# Patient Record
Sex: Male | Born: 1975 | Race: Black or African American | Hispanic: No | Marital: Single
Health system: Southern US, Community
[De-identification: ages and names within clinical notes are randomized; demographics above are authoritative.]

---

## 2005-04-11 ENCOUNTER — Emergency Department (HOSPITAL_COMMUNITY): Admission: EM | Admit: 2005-04-11 | Discharge: 2005-04-11 | Payer: Self-pay | Admitting: Emergency Medicine

## 2015-10-08 ENCOUNTER — Other Ambulatory Visit (HOSPITAL_COMMUNITY): Payer: Self-pay

## 2015-10-08 ENCOUNTER — Inpatient Hospital Stay (HOSPITAL_COMMUNITY): Payer: Medicaid Other

## 2015-10-08 ENCOUNTER — Emergency Department (HOSPITAL_COMMUNITY): Payer: Medicaid Other

## 2015-10-08 ENCOUNTER — Encounter (HOSPITAL_COMMUNITY): Payer: Self-pay | Admitting: *Deleted

## 2015-10-08 ENCOUNTER — Inpatient Hospital Stay (HOSPITAL_COMMUNITY)
Admission: EM | Admit: 2015-10-08 | Discharge: 2015-10-29 | DRG: 637 | Disposition: E | Payer: Medicaid Other | Attending: Pulmonary Disease | Admitting: Pulmonary Disease

## 2015-10-08 DIAGNOSIS — N179 Acute kidney failure, unspecified: Secondary | ICD-10-CM

## 2015-10-08 DIAGNOSIS — I10 Essential (primary) hypertension: Secondary | ICD-10-CM | POA: Diagnosis present

## 2015-10-08 DIAGNOSIS — R6510 Systemic inflammatory response syndrome (SIRS) of non-infectious origin without acute organ dysfunction: Secondary | ICD-10-CM

## 2015-10-08 DIAGNOSIS — R579 Shock, unspecified: Secondary | ICD-10-CM | POA: Diagnosis not present

## 2015-10-08 DIAGNOSIS — R402433 Glasgow coma scale score 3-8, at hospital admission: Secondary | ICD-10-CM | POA: Diagnosis present

## 2015-10-08 DIAGNOSIS — G9341 Metabolic encephalopathy: Secondary | ICD-10-CM | POA: Diagnosis present

## 2015-10-08 DIAGNOSIS — E875 Hyperkalemia: Secondary | ICD-10-CM | POA: Diagnosis not present

## 2015-10-08 DIAGNOSIS — Z833 Family history of diabetes mellitus: Secondary | ICD-10-CM

## 2015-10-08 DIAGNOSIS — R569 Unspecified convulsions: Secondary | ICD-10-CM | POA: Diagnosis present

## 2015-10-08 DIAGNOSIS — I4901 Ventricular fibrillation: Secondary | ICD-10-CM

## 2015-10-08 DIAGNOSIS — E1311 Other specified diabetes mellitus with ketoacidosis with coma: Secondary | ICD-10-CM | POA: Diagnosis present

## 2015-10-08 DIAGNOSIS — D689 Coagulation defect, unspecified: Secondary | ICD-10-CM

## 2015-10-08 DIAGNOSIS — D65 Disseminated intravascular coagulation [defibrination syndrome]: Secondary | ICD-10-CM | POA: Diagnosis present

## 2015-10-08 DIAGNOSIS — G40309 Generalized idiopathic epilepsy and epileptic syndromes, not intractable, without status epilepticus: Secondary | ICD-10-CM | POA: Diagnosis present

## 2015-10-08 DIAGNOSIS — J9690 Respiratory failure, unspecified, unspecified whether with hypoxia or hypercapnia: Secondary | ICD-10-CM

## 2015-10-08 DIAGNOSIS — R001 Bradycardia, unspecified: Secondary | ICD-10-CM | POA: Diagnosis not present

## 2015-10-08 DIAGNOSIS — I248 Other forms of acute ischemic heart disease: Secondary | ICD-10-CM | POA: Diagnosis present

## 2015-10-08 DIAGNOSIS — E860 Dehydration: Secondary | ICD-10-CM | POA: Diagnosis present

## 2015-10-08 DIAGNOSIS — T68XXXA Hypothermia, initial encounter: Secondary | ICD-10-CM

## 2015-10-08 DIAGNOSIS — R011 Cardiac murmur, unspecified: Secondary | ICD-10-CM | POA: Diagnosis present

## 2015-10-08 DIAGNOSIS — I259 Chronic ischemic heart disease, unspecified: Secondary | ICD-10-CM

## 2015-10-08 DIAGNOSIS — D649 Anemia, unspecified: Secondary | ICD-10-CM | POA: Diagnosis present

## 2015-10-08 DIAGNOSIS — E1110 Type 2 diabetes mellitus with ketoacidosis without coma: Secondary | ICD-10-CM | POA: Diagnosis present

## 2015-10-08 DIAGNOSIS — I462 Cardiac arrest due to underlying cardiac condition: Secondary | ICD-10-CM | POA: Diagnosis present

## 2015-10-08 DIAGNOSIS — E872 Acidosis: Secondary | ICD-10-CM

## 2015-10-08 LAB — URINALYSIS, ROUTINE W REFLEX MICROSCOPIC
BILIRUBIN URINE: NEGATIVE
Glucose, UA: >1000 mg/dL — AB
Ketones, ur: 40 mg/dL — AB
Leukocytes, UA: NEGATIVE
Nitrite: NEGATIVE
PROTEIN: NEGATIVE mg/dL
Specific Gravity, Urine: 1.01 (ref 1.005–1.030)
UROBILINOGEN UA: 0.2 mg/dL (ref 0.0–1.0)
pH: 5.5 (ref 5.0–8.0)

## 2015-10-08 LAB — CBG MONITORING, ED
GLUCOSE-CAPILLARY: 593 mg/dL — AB (ref 65–99)
Glucose-Capillary: >600 mg/dL (ref 65–99)

## 2015-10-08 LAB — CBC WITH DIFFERENTIAL/PLATELET
BASOS ABS: 0.1 10*3/uL (ref 0.0–0.1)
BASOS PCT: 1 %
EOS ABS: 0.1 10*3/uL (ref 0.0–0.7)
Eosinophils Relative: 1 %
HCT: 31 % — ABNORMAL LOW (ref 39.0–52.0)
Hemoglobin: 9.9 g/dL — ABNORMAL LOW (ref 13.0–17.0)
Lymphocytes Relative: 57 %
Lymphs Abs: 6.7 10*3/uL — ABNORMAL HIGH (ref 0.7–4.0)
MCH: 32.2 pg (ref 26.0–34.0)
MCHC: 31.9 g/dL (ref 30.0–36.0)
MCV: 101 fL — ABNORMAL HIGH (ref 78.0–100.0)
MONO ABS: 0.7 10*3/uL (ref 0.1–1.0)
Monocytes Relative: 6 %
NEUTROS ABS: 4.3 10*3/uL (ref 1.7–7.7)
Neutrophils Relative %: 35 %
PLATELETS: ADEQUATE 10*3/uL (ref 150–400)
RBC: 3.07 MIL/uL — ABNORMAL LOW (ref 4.22–5.81)
RDW: 13.6 % (ref 11.5–15.5)
SMEAR REVIEW: ADEQUATE
WBC: 11.8 10*3/uL — ABNORMAL HIGH (ref 4.0–10.5)

## 2015-10-08 LAB — BLOOD GAS, ARTERIAL
ACID-BASE DEFICIT: 29.4 mmol/L — AB (ref 0.0–2.0)
ACID-BASE DEFICIT: 29.2 mmol/L — AB (ref 0.0–2.0)
ACID-BASE DEFICIT: 28.1 mmol/L — AB (ref 0.0–2.0)
Bicarbonate: 3.5 mEq/L — ABNORMAL LOW (ref 20.0–24.0)
Bicarbonate: 3.8 mEq/L — ABNORMAL LOW (ref 20.0–24.0)
Bicarbonate: 2.8 mEq/L — ABNORMAL LOW (ref 20.0–24.0)
DRAWN BY: 213101
DRAWN BY: 213101
DRAWN BY: 398981
FIO2: 1
FIO2: 0.5
FIO2: 0.6
LHR: 14 {breaths}/min
MECHVT: 560 mL
O2 Saturation: 98.1 %
O2 Saturation: 97.1 %
O2 Saturation: 97.4 %
PEEP/CPAP: 5 cmH2O
PEEP/CPAP: 5 cmH2O
PEEP: 5 cmH2O
PH ART: 6.629 — AB (ref 7.350–7.450)
PH ART: 6.695 — AB (ref 7.350–7.450)
PO2 ART: 265 mmHg — AB (ref 80.0–100.0)
Patient temperature: 36.3
Patient temperature: 97.3
RATE: 14 resp/min
RATE: 30 resp/min
TCO2: 3.3 mmol/L (ref 0–100)
VT: 520 mL
VT: 520 mL
pCO2 arterial: 29.4 mmHg — ABNORMAL LOW (ref 35.0–45.0)
pCO2 arterial: 22.9 mmHg — ABNORMAL LOW (ref 35.0–45.0)
pCO2 arterial: 17.4 mmHg — CL (ref 35.0–45.0)
pH, Arterial: 6.824 — CL (ref 7.350–7.450)
pO2, Arterial: 521 mmHg — ABNORMAL HIGH (ref 80.0–100.0)
pO2, Arterial: 274 mmHg — ABNORMAL HIGH (ref 80.0–100.0)

## 2015-10-08 LAB — MRSA PCR SCREENING: MRSA BY PCR: NEGATIVE

## 2015-10-08 LAB — LACTIC ACID, PLASMA
LACTIC ACID, VENOUS: 26.9 mmol/L — AB (ref 0.5–2.0)
Lactic Acid, Venous: 21.1 mmol/L (ref 0.5–2.0)
Lactic Acid, Venous: 25.2 mmol/L (ref 0.5–2.0)

## 2015-10-08 LAB — COMPREHENSIVE METABOLIC PANEL
ALK PHOS: 112 U/L (ref 38–126)
ALT: <5 U/L — ABNORMAL LOW (ref 17–63)
AST: 47 U/L — ABNORMAL HIGH (ref 15–41)
Albumin: 3.1 g/dL — ABNORMAL LOW (ref 3.5–5.0)
BUN: 15 mg/dL (ref 6–20)
Calcium: 9.6 mg/dL (ref 8.9–10.3)
Chloride: 102 mmol/L (ref 101–111)
Creatinine, Ser: 2.46 mg/dL — ABNORMAL HIGH (ref 0.61–1.24)
GFR calc non Af Amer: 31 mL/min — ABNORMAL LOW (ref >60)
GFR, EST AFRICAN AMERICAN: 36 mL/min — AB (ref >60)
GLUCOSE: 799 mg/dL — AB (ref 65–99)
POTASSIUM: 4.4 mmol/L (ref 3.5–5.1)
SODIUM: 140 mmol/L (ref 135–145)
TOTAL PROTEIN: 5.5 g/dL — AB (ref 6.5–8.1)
Total Bilirubin: 0.6 mg/dL (ref 0.3–1.2)

## 2015-10-08 LAB — BASIC METABOLIC PANEL
Anion gap: 31 — ABNORMAL HIGH (ref 5–15)
BUN: 14 mg/dL (ref 6–20)
BUN: 12 mg/dL (ref 6–20)
CHLORIDE: 102 mmol/L (ref 101–111)
CHLORIDE: 104 mmol/L (ref 101–111)
CO2: 7 mmol/L — ABNORMAL LOW (ref 22–32)
CREATININE: 3.26 mg/dL — AB (ref 0.61–1.24)
Calcium: 6.5 mg/dL — ABNORMAL LOW (ref 8.9–10.3)
Calcium: 8.4 mg/dL — ABNORMAL LOW (ref 8.9–10.3)
Creatinine, Ser: 3.05 mg/dL — ABNORMAL HIGH (ref 0.61–1.24)
GFR, EST AFRICAN AMERICAN: 26 mL/min — AB (ref >60)
GFR, EST AFRICAN AMERICAN: 28 mL/min — AB (ref >60)
GFR, EST NON AFRICAN AMERICAN: 22 mL/min — AB (ref >60)
GFR, EST NON AFRICAN AMERICAN: 24 mL/min — AB (ref >60)
Glucose, Bld: 420 mg/dL — ABNORMAL HIGH (ref 65–99)
Glucose, Bld: 353 mg/dL — ABNORMAL HIGH (ref 65–99)
Potassium: >7.5 mmol/L (ref 3.5–5.1)
SODIUM: 142 mmol/L (ref 135–145)
Sodium: 138 mmol/L (ref 135–145)

## 2015-10-08 LAB — URINE MICROSCOPIC-ADD ON

## 2015-10-08 LAB — I-STAT CHEM 8, ED
BUN: 15 mg/dL (ref 6–20)
CHLORIDE: 101 mmol/L (ref 101–111)
Calcium, Ion: 1.31 mmol/L — ABNORMAL HIGH (ref 1.12–1.23)
Creatinine, Ser: 2.1 mg/dL — ABNORMAL HIGH (ref 0.61–1.24)
Glucose, Bld: >700 mg/dL (ref 65–99)
HEMATOCRIT: 37 % — AB (ref 39.0–52.0)
Hemoglobin: 12.6 g/dL — ABNORMAL LOW (ref 13.0–17.0)
POTASSIUM: 4.1 mmol/L (ref 3.5–5.1)
Sodium: 139 mmol/L (ref 135–145)
TCO2: 7 mmol/L (ref 0–100)

## 2015-10-08 LAB — GLUCOSE, CAPILLARY
GLUCOSE-CAPILLARY: 470 mg/dL — AB (ref 65–99)
GLUCOSE-CAPILLARY: 440 mg/dL — AB (ref 65–99)
GLUCOSE-CAPILLARY: 377 mg/dL — AB (ref 65–99)
Glucose-Capillary: 392 mg/dL — ABNORMAL HIGH (ref 65–99)

## 2015-10-08 LAB — RAPID URINE DRUG SCREEN, HOSP PERFORMED
Amphetamines: NOT DETECTED
BARBITURATES: NOT DETECTED
Benzodiazepines: NOT DETECTED
Cocaine: NOT DETECTED
OPIATES: NOT DETECTED
TETRAHYDROCANNABINOL: NOT DETECTED

## 2015-10-08 LAB — CBC
HCT: 18.1 % — ABNORMAL LOW (ref 39.0–52.0)
HEMOGLOBIN: 6.2 g/dL — AB (ref 13.0–17.0)
MCH: 32 pg (ref 26.0–34.0)
MCHC: 34.3 g/dL (ref 30.0–36.0)
MCV: 93.3 fL (ref 78.0–100.0)
PLATELETS: 93 10*3/uL — AB (ref 150–400)
RBC: 1.94 MIL/uL — ABNORMAL LOW (ref 4.22–5.81)
RDW: 13.6 % (ref 11.5–15.5)
WBC: 19.4 10*3/uL — ABNORMAL HIGH (ref 4.0–10.5)

## 2015-10-08 LAB — SALICYLATE LEVEL: Salicylate Lvl: <4 mg/dL (ref 2.8–30.0)

## 2015-10-08 LAB — PATHOLOGIST SMEAR REVIEW

## 2015-10-08 LAB — ETHANOL: Alcohol, Ethyl (B): <5 mg/dL (ref <5)

## 2015-10-08 LAB — ACETAMINOPHEN LEVEL: Acetaminophen (Tylenol), Serum: <10 ug/mL — ABNORMAL LOW (ref 10–30)

## 2015-10-08 LAB — TROPONIN I
TROPONIN I: 0.04 ng/mL — AB (ref <0.031)
TROPONIN I: 0.25 ng/mL — AB (ref <0.031)
TROPONIN I: 0.31 ng/mL — AB (ref <0.031)

## 2015-10-08 LAB — PHOSPHORUS: Phosphorus: 21.2 mg/dL — ABNORMAL HIGH (ref 2.5–4.6)

## 2015-10-08 LAB — BRAIN NATRIURETIC PEPTIDE: B NATRIURETIC PEPTIDE 5: 31.3 pg/mL (ref 0.0–100.0)

## 2015-10-08 LAB — TRIGLYCERIDES: TRIGLYCERIDES: 609 mg/dL — AB (ref <150)

## 2015-10-08 LAB — MAGNESIUM
MAGNESIUM: 3.1 mg/dL — AB (ref 1.7–2.4)
MAGNESIUM: 4.6 mg/dL — AB (ref 1.7–2.4)

## 2015-10-08 LAB — BETA-HYDROXYBUTYRIC ACID: BETA-HYDROXYBUTYRIC ACID: 0.75 mmol/L — AB (ref 0.05–0.27)

## 2015-10-08 LAB — ETHYLENE GLYCOL: ETHYLENE GLYCOL LVL: NOT DETECTED mg/dL

## 2015-10-08 LAB — TSH: TSH: 19.637 u[IU]/mL — ABNORMAL HIGH (ref 0.350–4.500)

## 2015-10-08 MED ORDER — PANTOPRAZOLE SODIUM 40 MG IV SOLR
40.0000 mg | INTRAVENOUS | Status: DC
  Administered 2015-10-08: 40 mg via INTRAVENOUS
  Filled 2015-10-08: qty 40

## 2015-10-08 MED ORDER — NOREPINEPHRINE BITARTRATE 1 MG/ML IV SOLN
0.0000 ug/min | INTRAVENOUS | Status: DC
  Administered 2015-10-08: 40 ug/min via INTRAVENOUS
  Filled 2015-10-08 (×3): qty 16

## 2015-10-08 MED ORDER — OXYMETAZOLINE HCL 0.05 % NA SOLN
NASAL | Status: AC
  Filled 2015-10-08: qty 15

## 2015-10-08 MED ORDER — MIDAZOLAM HCL 2 MG/2ML IJ SOLN: 2.0000 mg | INTRAMUSCULAR | Status: DC | PRN

## 2015-10-08 MED ORDER — PROPOFOL 1000 MG/100ML IV EMUL: 5.0000 ug/kg/min | Freq: Once | INTRAVENOUS | Status: DC

## 2015-10-08 MED ORDER — SODIUM CHLORIDE 0.9 % IV SOLN
INTRAVENOUS | Status: DC
  Filled 2015-10-08: qty 2.5

## 2015-10-08 MED ORDER — PROPOFOL 1000 MG/100ML IV EMUL
INTRAVENOUS | Status: AC
  Filled 2015-10-08: qty 100

## 2015-10-08 MED ORDER — SODIUM CHLORIDE 0.9 % IV BOLUS (SEPSIS)
1000.0000 mL | Freq: Once | INTRAVENOUS | Status: AC
  Administered 2015-10-08: 1000 mL via INTRAVENOUS

## 2015-10-08 MED ORDER — ETOMIDATE 2 MG/ML IV SOLN
INTRAVENOUS | Status: AC
Start: 2015-10-08 — End: 2015-10-08
  Filled 2015-10-08: qty 20

## 2015-10-08 MED ORDER — NOREPINEPHRINE BITARTRATE 1 MG/ML IV SOLN
0.0000 ug/min | INTRAVENOUS | Status: DC
  Administered 2015-10-08: 5 ug/min via INTRAVENOUS
  Filled 2015-10-08: qty 4

## 2015-10-08 MED ORDER — EPINEPHRINE HCL 0.1 MG/ML IJ SOSY
PREFILLED_SYRINGE | INTRAMUSCULAR | Status: AC
  Filled 2015-10-08: qty 10

## 2015-10-08 MED ORDER — SODIUM BICARBONATE 8.4 % IV SOLN
INTRAVENOUS | Status: AC
  Filled 2015-10-08: qty 150

## 2015-10-08 MED ORDER — DEXTROSE-NACL 5-0.45 % IV SOLN: INTRAVENOUS | Status: DC

## 2015-10-08 MED ORDER — POTASSIUM CHLORIDE 10 MEQ/100ML IV SOLN
10.0000 meq | INTRAVENOUS | Status: DC
  Filled 2015-10-08: qty 100

## 2015-10-08 MED ORDER — SODIUM BICARBONATE 8.4 % IV SOLN
INTRAVENOUS | Status: AC
  Filled 2015-10-08: qty 50

## 2015-10-08 MED ORDER — STERILE WATER FOR INJECTION IV SOLN
INTRAVENOUS | Status: DC
  Filled 2015-10-08 (×3): qty 850

## 2015-10-08 MED ORDER — HEPARIN SODIUM (PORCINE) 5000 UNIT/ML IJ SOLN
5000.0000 [IU] | Freq: Three times a day (TID) | INTRAMUSCULAR | Status: DC
  Filled 2015-10-08 (×3): qty 1

## 2015-10-08 MED ORDER — LORAZEPAM 2 MG/ML IJ SOLN
INTRAMUSCULAR | Status: AC
  Filled 2015-10-08: qty 1

## 2015-10-08 MED ORDER — LIDOCAINE HCL (CARDIAC) 20 MG/ML IV SOLN
INTRAVENOUS | Status: AC
  Filled 2015-10-08: qty 5

## 2015-10-08 MED ORDER — MAGNESIUM SULFATE 2 GM/50ML IV SOLN
INTRAVENOUS | Status: DC
Start: 2015-10-08 — End: 2015-10-08
  Filled 2015-10-08: qty 50

## 2015-10-08 MED ORDER — FENTANYL BOLUS VIA INFUSION
50.0000 ug | INTRAVENOUS | Status: DC | PRN
  Filled 2015-10-08: qty 50

## 2015-10-08 MED ORDER — ROCURONIUM BROMIDE 50 MG/5ML IV SOLN
INTRAVENOUS | Status: AC
Start: 2015-10-08 — End: 2015-10-08
  Filled 2015-10-08: qty 2

## 2015-10-08 MED ORDER — LEVETIRACETAM IN NACL 1000 MG/100ML IV SOLN
1000.0000 mg | Freq: Once | INTRAVENOUS | Status: AC
  Administered 2015-10-08: 1000 mg via INTRAVENOUS
  Filled 2015-10-08: qty 100

## 2015-10-08 MED ORDER — SODIUM BICARBONATE 8.4 % IV SOLN
INTRAVENOUS | Status: AC
  Filled 2015-10-08: qty 100

## 2015-10-08 MED ORDER — FENTANYL CITRATE (PF) 100 MCG/2ML IJ SOLN: 100.0000 ug | INTRAMUSCULAR | Status: DC | PRN

## 2015-10-08 MED ORDER — SODIUM CHLORIDE 0.9 % IV SOLN
INTRAVENOUS | Status: DC
  Administered 2015-10-08: 05:00:00 via INTRAVENOUS

## 2015-10-08 MED ORDER — FENTANYL CITRATE (PF) 100 MCG/2ML IJ SOLN: 50.0000 ug | Freq: Once | INTRAMUSCULAR | Status: DC

## 2015-10-08 MED ORDER — SODIUM CHLORIDE 0.9 % IV SOLN
500.0000 mg | Freq: Two times a day (BID) | INTRAVENOUS | Status: DC
  Administered 2015-10-08: 500 mg via INTRAVENOUS
  Filled 2015-10-08 (×2): qty 5

## 2015-10-08 MED ORDER — PROPOFOL 1000 MG/100ML IV EMUL: 0.0000 ug/kg/min | INTRAVENOUS | Status: DC

## 2015-10-08 MED ORDER — SODIUM CHLORIDE 0.9 % IV BOLUS (SEPSIS)
500.0000 mL | Freq: Once | INTRAVENOUS | Status: AC
  Administered 2015-10-08: 500 mL via INTRAVENOUS

## 2015-10-08 MED ORDER — SODIUM CHLORIDE 0.9 % IV SOLN
25.0000 ug/h | INTRAVENOUS | Status: DC
  Filled 2015-10-08: qty 50

## 2015-10-08 MED ORDER — SUCCINYLCHOLINE CHLORIDE 20 MG/ML IJ SOLN
INTRAMUSCULAR | Status: AC
  Filled 2015-10-08: qty 1

## 2015-10-08 MED ORDER — SODIUM CHLORIDE 0.9 % IV SOLN
INTRAVENOUS | Status: DC
  Administered 2015-10-08: 4.4 [IU]/h via INTRAVENOUS
  Filled 2015-10-08: qty 2.5

## 2015-10-08 MED ORDER — SODIUM CHLORIDE 0.9 % IV BOLUS (SEPSIS)
2000.0000 mL | Freq: Once | INTRAVENOUS | Status: AC
  Administered 2015-10-08: 2000 mL via INTRAVENOUS

## 2015-10-08 MED FILL — Medication: Qty: 1 | Status: AC

## 2015-10-09 LAB — URINE CULTURE: CULTURE: NO GROWTH

## 2015-10-09 LAB — HIV ANTIBODY (ROUTINE TESTING W REFLEX): HIV Screen 4th Generation wRfx: NONREACTIVE

## 2015-10-09 LAB — HEMOGLOBIN A1C
Hgb A1c MFr Bld: 17.2 % — ABNORMAL HIGH (ref 4.8–5.6)
Mean Plasma Glucose: 447 mg/dL

## 2015-10-15 ENCOUNTER — Telehealth: Payer: Self-pay

## 2015-10-15 NOTE — Telephone Encounter (Signed)
On 10/15/2015 I received a death certificate from [facility]. The death certificate is for burial. The patient is a patient of [facility]. The death certificate will be taken to the [facility]) this am for signature. On 10/15/2015 I received the death certificate back from [facility]. I got the death certificate ready and called the funeral home to let them know the death [facility] was being [facility] per their request.

## 2015-10-29 NOTE — H&P (Signed)
PULMONARY / CRITICAL CARE MEDICINE   Name: Zachary Weiss MRN: [id number] DOB: [birth year]    ADMISSION DATE:  2015-10-26 CONSULTATION DATE:  10/09/15  REFERRING MD :  EDP at [facility]  CHIEF COMPLAINT:  AMS  INITIAL PRESENTATION:  39 y.o. male brought to [facility] ED 11/10 after family found him at home with seizure activity. In ED, he was intubated for GCS of 3 and labs suggested severe DKA.  PCCM called for transfer to [facility] ICU.    STUDIES:  EKG head/C-spine 11/10 >>> no acute process. Degenerative changes in the cervical spine.  SIGNIFICANT EVENTS: 11/10 - admitted to [facility] ICU with severe DKA.   HISTORY OF PRESENT ILLNESS:  Pt is encephalopathic; therefore, this HPI is obtained from chart review. Zachary Weiss is a 38 y.o. male with a PMH as outlined below. He was at his home evening of 11/10 when family heard him fall out of the bed. When they came into the room they noticed that he was having seizure activity therefore they dispatched EMS. On EMS arrival patient was unresponsive, and CBG was reading "high". Upon arrival to ED,  seizure activity stopped spontaneously however he had GCS of 3 for which he was subsequently intubated.  He later had a second seizure for which she was treated with IV Ativan.  ABG revealed severe metabolic acidosis and additional labs suggestive of severe DKA.  Patient was subsequently transferred to [facility] for further evaluation and management.  Family reported to EDP the patient had been complaining of increased thirst and urination for at least the past several weeks. They informed EDP that diabetes runs in the family but patient never been diagnosed.  PAST MEDICAL HISTORY :   has no past medical history on file.  has no past surgical history on file. Prior to Admission medications   Not on File   Not on File  FAMILY HISTORY:  No family history on file.  SOCIAL HISTORY:  has no tobacco, alcohol, and drug history on file.  REVIEW OF SYSTEMS:   Unable to obtain as pt is encephalopathic.  SUBJECTIVE:   VITAL SIGNS: Temp:  [96.3 F (35.7 C)-98.2 F (36.8 C)] 97.3 F (36.3 C) (11/10 0414) Pulse Rate:  [36-105] 105 (11/10 0402) Resp:  [16-30] 30 (11/10 0402) BP: (72-117)/(43-68) 117/57 mmHg (11/10 0215) SpO2:  [92 %-100 %] 100 % (11/10 0402) FiO2 (%):  [50 %-100 %] 50 % (11/10 0402) Weight:  [81.647 kg (180 lb)] 81.647 kg (180 lb) (11/10 0049) HEMODYNAMICS:   VENTILATOR SETTINGS: Vent Mode:  [-] PRVC FiO2 (%):  [50 %-100 %] 50 % Set Rate:  [14 bmp-30 bmp] 30 bmp Vt Set:  [520 mL-560 mL] 560 mL PEEP:  [5 cmH20] 5 cmH20 Plateau Pressure:  [18 cmH20-34 cmH20] 18 cmH20 INTAKE / OUTPUT: Intake/Output    None     PHYSICAL EXAMINATION: General: Adult male, in NAD. Neuro: Sedated, non-responsive.  GCS 3 HEENT: [place]/AT. Pupils non-reactive. Cardiovascular: RRR, 2/6 SEM.  Lungs: Respirations even and unlabored.  CTA bilaterally, No W/R/R. Abdomen: BS x 4, soft, NT/ND.  Musculoskeletal: No gross deformities, no edema.  Skin: Intact, warm, no rashes.  LABS:  CBC  Recent Labs Lab 10-26-15 0039 October 26, 2015 0051  WBC  --  11.8*  HGB 12.6* 9.9*  HCT 37.0* 31.0*  PLT  --  PLATELET CLUMPS NOTED ON SMEAR, COUNT APPEARS ADEQUATE   Coag's No results for input(s): APTT, INR in the last 168 hours. BMET  Recent Labs Lab 10-26-2015 0039 Oct 26, 2015  0051  NA 139 140  K 4.1 4.4  CL 101 102  CO2  --  <7*  BUN 15 15  CREATININE 2.10* 2.46*  GLUCOSE >700* 799*   Electrolytes  Recent Labs Lab 09-09-15 0051  CALCIUM 9.6  MG 3.1*   Sepsis Markers  Recent Labs Lab 09-09-15 0051  LATICACIDVEN 26.9*   ABG  Recent Labs Lab 09-09-15 0100 09-09-15 0223  PHART 6.629* 6.695*  PCO2ART 29.4* 22.9*  PO2ART 521* 274*   Liver Enzymes  Recent Labs Lab 09-09-15 0051  AST 47*  ALT <5*  ALKPHOS 112  BILITOT 0.6  ALBUMIN 3.1*   Cardiac Enzymes  Recent Labs Lab 09-09-15 0112  TROPONINI 0.04*   Glucose  Recent  Labs Lab 09-09-15 0029 09-09-15 0110 09-09-15 0222 09-09-15 0412  GLUCAP >600* 593* >600* 470*    Imaging Ct Head Wo Contrast  2015-08-16  CLINICAL DATA:  Fall out of bed, found unresponsive, seizure. EXAM: CT HEAD WITHOUT CONTRAST CT CERVICAL SPINE WITHOUT CONTRAST TECHNIQUE: Multidetector CT imaging of the head and cervical spine was performed following the standard protocol without intravenous contrast. Multiplanar CT image reconstructions of the cervical spine were also generated. COMPARISON:  None. FINDINGS: CT HEAD FINDINGS Mild motion artifact related seizure. No intracranial hemorrhage, mass effect, or midline shift. No hydrocephalus. The basilar cisterns are patent. No evidence of territorial infarct. No intracranial fluid collection. Calvarium is intact. Mucosal thickening of the ethmoid air cells, may be related to intubation. Mastoid air cells are clear. Mastoid air cells are well aerated. CT CERVICAL SPINE FINDINGS Mild motion artifact related seizure. Straightening of normal lordosis. There is no fracture. The dens is intact. There are no jumped or perched facets. Disc space narrowing and endplate spurring at C5-C6 and C6-C7. No prevertebral soft tissue edema. Patient is intubated. An enteric tube is in place. Mild emphysematous changes noted at the lung apices. IMPRESSION: 1. No acute intracranial abnormality, allowing for mild motion artifact. 2. Degenerative change in the cervical spine without acute fracture or subluxation. Electronically Signed   By: Rubye OaksMelanie  Ehinger M.D.   On: 2015-08-16 02:17   Ct Cervical Spine Wo Contrast  2015-08-16  CLINICAL DATA:  Fall out of bed, found unresponsive, seizure. EXAM: CT HEAD WITHOUT CONTRAST CT CERVICAL SPINE WITHOUT CONTRAST TECHNIQUE: Multidetector CT imaging of the head and cervical spine was performed following the standard protocol without intravenous contrast. Multiplanar CT image reconstructions of the cervical spine were also  generated. COMPARISON:  None. FINDINGS: CT HEAD FINDINGS Mild motion artifact related seizure. No intracranial hemorrhage, mass effect, or midline shift. No hydrocephalus. The basilar cisterns are patent. No evidence of territorial infarct. No intracranial fluid collection. Calvarium is intact. Mucosal thickening of the ethmoid air cells, may be related to intubation. Mastoid air cells are clear. Mastoid air cells are well aerated. CT CERVICAL SPINE FINDINGS Mild motion artifact related seizure. Straightening of normal lordosis. There is no fracture. The dens is intact. There are no jumped or perched facets. Disc space narrowing and endplate spurring at C5-C6 and C6-C7. No prevertebral soft tissue edema. Patient is intubated. An enteric tube is in place. Mild emphysematous changes noted at the lung apices. IMPRESSION: 1. No acute intracranial abnormality, allowing for mild motion artifact. 2. Degenerative change in the cervical spine without acute fracture or subluxation. Electronically Signed   By: Rubye OaksMelanie  Ehinger M.D.   On: 2015-08-16 02:17   Dg Chest Portable 1 View  2015-08-16  CLINICAL DATA:  Endotracheal and NG tube placement.  Fall from bed tonight. EXAM: PORTABLE CHEST 1 VIEW COMPARISON:  None. FINDINGS: Endotracheal tube is 1.9 cm from the carina. Enteric tube in place, tip below the diaphragm not included in the field of view. Lung volumes are low. Minimal right infrahilar atelectasis, lungs otherwise clear. Cardiomediastinal contours are normal. No pulmonary edema, large pleural effusion or pneumothorax. No acute osseous abnormalities are seen. IMPRESSION: 1. Endotracheal tube 1.9 cm from the carina. Tip of the enteric tube below the diaphragm not included in the field of view. 2. Low lung volumes with mild right infrahilar atelectasis. Electronically Signed   By: Rubye Oaks M.D.   On: 10/27/15 00:56    ASSESSMENT / PLAN:  ENDOCRINE A:   Presumed severe DKA. Probable undiagnosed DM.    P:   Insulin per DKA protocol. Assess beta hydroxybutyric acid, Hgb A1c, TSH.  NEUROLOGIC A:   Acute metabolic encephalopathy due to severe acidosis + sedation. Seizure activity - likely due to hyperglycemia / DKA.  Negative UDS noted. P:   Sedation:  Fentanyl gtt / Midazolam PRN. RASS goal: 0 to -1. Daily WUA. Keppra. Neurology consulted. Probable EEG.  PULMONARY OETT 11/10 >>> A: VDRF due to inability to protect airway in the setting of seizure activity and severe DKA. P:   Full mechanical support, wean as able. VAP prevention measures. SBT in AM if more stable. CXR in AM.  CARDIOVASCULAR A:  Troponin leak - suspect demand ischemia. Sinus tachycardia - likely due to hypovolemia. Systolic murmur - unclear whether this is chronic or not. P:  Trend troponins / lactate. Monitor hemodynamics. Consider echo for further evaluation of murmur.  RENAL A:   AKI - unclear of baseline. Severe AG metabolic acidosis - due to lactate + presumed DKA. P:   HCO3 at 125 given severe acidosis. Additional fluids / insulin per DKA protocol. Correct electrolytes as indicated. BMP q2hrs x 4.  GASTROINTESTINAL A:   GI prophylaxis. Nutrition. P:   SUP: Pantoprazole. NPO.  HEMATOLOGIC A:   Mild anemia. VTE Prophylaxis. P:  Transfuse for Hgb < 7. SCD's / Heparin. CBC in AM.  INFECTIOUS A:   No indication of infection. P:   Monitor clinically.   Family updated: Wife at bedside.  Interdisciplinary Family Meeting v Palliative Care Meeting:  Due by: 11/16.   Rutherford Guys, [place] - [facility] Pager: [phone]  or [phone] 2015-10-27, 4:32 AM

## 2015-10-29 NOTE — ED Notes (Signed)
CRITICAL VALUE ALERT  Critical value received:  Lactic acid 26.9   Date of notification:  2015/09/03  Time of notification:  0206  Critical value read back:Yes.    Nurse who received alert:  Rcockram  MD notified (1st page):  Mcmanus

## 2015-10-29 NOTE — Consult Note (Signed)
Admission H&P    Chief Complaint: Altered mental status with generalized seizures.  HPI: Zachary Weiss is an 39 y.o. male with a history of hypertension brought to [facility] ED by family following a witnessed generalized seizure at home. Blood sugar was greater than 799. Patient was intubated and placed on mechanical ventilation. He has second seizure after arriving in the ED was given Ativan IV. He was also given 1 g of Keppra IV. No further seizure activity reported. CT scan of his head showed no acute intracranial abnormality. Blood gas showed a pH of 6.695. Urinalysis was abnormal with many bacteria. WBC was 11.8, lactic acid was 26.9 and patient was hypothermic. Point family he's been complaining of severe thirst and had no other complaints. Patient has never been noticed with diabetes mellitus. Family history is very strong for diabetes, however.  Past medical history: Hypertension  History reviewed. No pertinent past surgical history.  Family history: Very strong for diabetes mellitus involving both sides of his family.  Social History:  has no tobacco, alcohol, and drug history on file.  Allergies: Not on File  No prescriptions prior to admission    ROS: History obtained from patient's family.  General ROS: negative for - chills, fatigue, fever, night sweats, weight gain or weight loss Psychological ROS: negative for - behavioral disorder, hallucinations, memory difficulties, mood swings or suicidal ideation Ophthalmic ROS: negative for - blurry vision, double vision, eye pain or loss of vision ENT ROS: negative for - epistaxis, nasal discharge, oral lesions, sore throat, tinnitus or vertigo Allergy and Immunology ROS: negative for - hives or itchy/watery eyes Hematological and Lymphatic ROS: negative for - bleeding problems, bruising or swollen lymph nodes Endocrine ROS: negative for - galactorrhea, hair pattern changes, polydipsia/polyuria or temperature intolerance Respiratory  ROS: negative for - cough, hemoptysis, shortness of breath or wheezing Cardiovascular ROS: negative for - chest pain, dyspnea on exertion, edema or irregular heartbeat Gastrointestinal ROS: negative for - abdominal pain, diarrhea, hematemesis, nausea/vomiting or stool incontinence Genito-Urinary ROS: negative for - dysuria, hematuria, incontinence or urinary frequency/urgency Musculoskeletal ROS: negative for - joint swelling or muscular weakness Neurological ROS: as noted in HPI Dermatological ROS: negative for rash and skin lesion changes  Physical Examination: Blood pressure 92/46, pulse 87, temperature 97.3 F (36.3 C), temperature source Oral, resp. rate 30, height _0  (1.753 m), weight 81.647 kg (180 lb), SpO2 100 %.  HEENT-  Normocephalic, no lesions, without obvious abnormality.  Normal external eye and conjunctiva.  Normal TM's bilaterally.  Normal auditory canals and external ears. Normal external nose, mucus membranes and septum.  Normal pharynx. Neck supple with no masses, nodes, nodules or enlargement. Cardiovascular - normal rate and rhythm; 2/6 systolic murmur Lungs - chest clear, no wheezing, rales, normal symmetric air entry Abdomen - soft, non-tender; bowel sounds normal; no masses,  no organomegaly Extremities - no joint deformities, effusion, or inflammation and no edema  Neurologic Examination: Patient was intubated and on mechanical ventilation. He had no responses to noxious stimuli. He is not on sedating medication. Pupils were dilated at about 6 mm and unreactive to light. Extraocular movements were absent with oculocephalic maneuvers. No facial weakness was noted. Muscle tone was flaccid throughout. Patient had no spontaneous movements and no abnormal posturing. Deep tendon reflexes were absent. Plantar responses were mute.  Results for orders placed or performed during the hospital encounter of 17-Oct-2015 (from the past 48 hour(s))  CBG monitoring, ED      Status: Abnormal   Collection  Time: 10/28/2015 12:29 AM  Result Value Ref Range   Glucose-Capillary >600 (HH) 65 - 99 mg/dL  I-Stat Chem 8, ED  (not at [facility], [facility])     Status: Abnormal   Collection Time: Oct 28, 2015 12:39 AM  Result Value Ref Range   Sodium 139 135 - 145 mmol/L   Potassium 4.1 3.5 - 5.1 mmol/L   Chloride 101 101 - 111 mmol/L   BUN 15 6 - 20 mg/dL   Creatinine, Ser 2.10 (H) 0.61 - 1.24 mg/dL   Glucose, Bld >700 (HH) 65 - 99 mg/dL   Calcium, Ion 1.31 (H) 1.12 - 1.23 mmol/L   TCO2 7 0 - 100 mmol/L   Hemoglobin 12.6 (L) 13.0 - 17.0 g/dL   HCT 37.0 (L) 39.0 - 52.0 %   Comment NOTIFIED PHYSICIAN   Urinalysis, Routine w reflex microscopic (not at [facility])     Status: Abnormal   Collection Time: 10-28-2015 12:51 AM  Result Value Ref Range   Color, Urine STRAW (A) YELLOW   APPearance HAZY (A) CLEAR   Specific Gravity, Urine 1.010 1.005 - 1.030   pH 5.5 5.0 - 8.0   Glucose, UA >1000 (A) NEGATIVE mg/dL   Hgb urine dipstick MODERATE (A) NEGATIVE   Bilirubin Urine NEGATIVE NEGATIVE   Ketones, ur 40 (A) NEGATIVE mg/dL   Protein, ur NEGATIVE NEGATIVE mg/dL   Urobilinogen, UA 0.2 0.0 - 1.0 mg/dL   Nitrite NEGATIVE NEGATIVE   Leukocytes, UA NEGATIVE NEGATIVE  Comprehensive metabolic panel     Status: Abnormal   Collection Time: 28-Oct-2015 12:51 AM  Result Value Ref Range   Sodium 140 135 - 145 mmol/L   Potassium 4.4 3.5 - 5.1 mmol/L   Chloride 102 101 - 111 mmol/L   CO2 <7 (L) 22 - 32 mmol/L   Glucose, Bld 799 (HH) 65 - 99 mg/dL    Comment: CRITICAL RESULT CALLED TO, READ BACK BY AND VERIFIED WITH: NORMAN B AT 0153 ON 2015-06-30 BY FORSYTH K    BUN 15 6 - 20 mg/dL   Creatinine, Ser 2.46 (H) 0.61 - 1.24 mg/dL   Calcium 9.6 8.9 - 10.3 mg/dL   Total Protein 5.5 (L) 6.5 - 8.1 g/dL   Albumin 3.1 (L) 3.5 - 5.0 g/dL   AST 47 (H) 15 - 41 U/L   ALT <5 (L) 17 - 63 U/L   Alkaline Phosphatase 112 38 - 126 U/L   Total Bilirubin 0.6 0.3 - 1.2 mg/dL   GFR calc non Af Amer 31 (L) >60 mL/min    GFR calc Af Amer 36 (L) >60 mL/min    Comment: (NOTE) The eGFR has been calculated using the CKD EPI equation. This calculation has not been validated in all clinical situations. eGFR's persistently <60 mL/min signify possible Chronic Kidney Disease. CORRECTED ON 11/10 AT 0247: PREVIOUSLY REPORTED AS 36   Ethanol     Status: None   Collection Time: 10-28-15 12:51 AM  Result Value Ref Range   Alcohol, Ethyl (B) <5 <5 mg/dL    Comment:        LOWEST DETECTABLE LIMIT FOR SERUM ALCOHOL IS 5 mg/dL FOR MEDICAL PURPOSES ONLY   Acetaminophen level     Status: Abnormal   Collection Time: 2015/10/28 12:51 AM  Result Value Ref Range   Acetaminophen (Tylenol), Serum <10 (L) 10 - 30 ug/mL    Comment:        THERAPEUTIC CONCENTRATIONS VARY SIGNIFICANTLY. A RANGE OF 10-30 ug/mL MAY BE AN EFFECTIVE CONCENTRATION FOR  MANY PATIENTS. HOWEVER, SOME ARE BEST TREATED AT CONCENTRATIONS OUTSIDE THIS RANGE. ACETAMINOPHEN CONCENTRATIONS >150 ug/mL AT 4 HOURS AFTER INGESTION AND >50 ug/mL AT 12 HOURS AFTER INGESTION ARE OFTEN ASSOCIATED WITH TOXIC REACTIONS.   Lactic acid, plasma     Status: Abnormal   Collection Time: Oct 22, 2015 12:51 AM  Result Value Ref Range   Lactic Acid, Venous 26.9 (HH) 0.5 - 2.0 mmol/L    Comment: RESULTS CONFIRMED BY MANUAL DILUTION CRITICAL RESULT CALLED TO, READ BACK BY AND VERIFIED WITH: COCKRAM R 0205 ON 2015-10-02 BY FORSYTH K   Salicylate level     Status: None   Collection Time: 10-22-15 12:51 AM  Result Value Ref Range   Salicylate Lvl <4.6 2.8 - 30.0 mg/dL  CBC with Differential     Status: Abnormal   Collection Time: 10-22-2015 12:51 AM  Result Value Ref Range   WBC 11.8 (H) 4.0 - 10.5 K/uL    Comment: REPEATED TO VERIFY   RBC 3.07 (L) 4.22 - 5.81 MIL/uL   Hemoglobin 9.9 (L) 13.0 - 17.0 g/dL    Comment: DELTA CHECK NOTED   HCT 31.0 (L) 39.0 - 52.0 %   MCV 101.0 (H) 78.0 - 100.0 fL   MCH 32.2 26.0 - 34.0 pg   MCHC 31.9 30.0 - 36.0 g/dL   RDW 13.6 11.5 - 15.5  %   Platelets  150 - 400 K/uL    PLATELET CLUMPS NOTED ON SMEAR, COUNT APPEARS ADEQUATE    Comment: SPECIMEN CHECKED FOR CLOTS   Neutrophils Relative % 35 %   Neutro Abs 4.3 1.7 - 7.7 K/uL   Lymphocytes Relative 57 %   Lymphs Abs 6.7 (H) 0.7 - 4.0 K/uL   Monocytes Relative 6 %   Monocytes Absolute 0.7 0.1 - 1.0 K/uL   Eosinophils Relative 1 %   Eosinophils Absolute 0.1 0.0 - 0.7 K/uL   Basophils Relative 1 %   Basophils Absolute 0.1 0.0 - 0.1 K/uL   WBC Morphology ATYPICAL LYMPHOCYTES     Comment: PENDING PATHOLOGIST REVIEW   Smear Review      PLATELET CLUMPS NOTED ON SMEAR, COUNT APPEARS ADEQUATE  Urine rapid drug screen (hosp performed)     Status: None   Collection Time: Oct 22, 2015 12:51 AM  Result Value Ref Range   Opiates NONE DETECTED NONE DETECTED   Cocaine NONE DETECTED NONE DETECTED   Benzodiazepines NONE DETECTED NONE DETECTED   Amphetamines NONE DETECTED NONE DETECTED   Tetrahydrocannabinol NONE DETECTED NONE DETECTED   Barbiturates NONE DETECTED NONE DETECTED    Comment:        DRUG SCREEN FOR MEDICAL PURPOSES ONLY.  IF CONFIRMATION IS NEEDED FOR ANY PURPOSE, NOTIFY LAB WITHIN 5 DAYS.        LOWEST DETECTABLE LIMITS FOR URINE DRUG SCREEN Drug Class       Cutoff (ng/mL) Amphetamine      1000 Barbiturate      200 Benzodiazepine   270 Tricyclics       350 Opiates          300 Cocaine          300 THC              50   Magnesium     Status: Abnormal   Collection Time: Oct 22, 2015 12:51 AM  Result Value Ref Range   Magnesium 3.1 (H) 1.7 - 2.4 mg/dL  Urine microscopic-add on     Status: Abnormal   Collection Time: 22-Oct-2015 12:51 AM  Result Value Ref Range   Squamous Epithelial / LPF FEW (A) RARE   WBC, UA 0-2 <3 WBC/hpf   RBC / HPF 0-2 <3 RBC/hpf   Bacteria, UA MANY (A) RARE  Blood gas, arterial  ([facility], [facility], [facility])     Status: Abnormal   Collection Time: 10/18/15  1:00 AM  Result Value Ref Range   FIO2 1.00    Delivery systems VENTILATOR    Mode PRESSURE  REGULATED VOLUME CONTROL    VT 520 mL   LHR 14.0 resp/min   Peep/cpap 5.0 cm H20   pH, Arterial 6.629 (LL) 7.350 - 7.450    Comment: CRITICAL RESULT CALLED TO, READ BACK BY AND VERIFIED WITH:  DR [facility] BY BFRETWELL RRT ON October 18, 2015 AT 0110    pCO2 arterial 29.4 (L) 35.0 - 45.0 mmHg   pO2, Arterial 521 (H) 80.0 - 100.0 mmHg   Bicarbonate 3.5 (L) 20.0 - 24.0 mEq/L   Acid-base deficit 29.4 (H) 0.0 - 2.0 mmol/L   O2 Saturation 98.1 %   Patient temperature 36.3    Collection site LEFT BRACHIAL    Drawn by [id number]    Sample type ARTERIAL DRAW   CBG monitoring, ED     Status: Abnormal   Collection Time: October 18, 2015  1:10 AM  Result Value Ref Range   Glucose-Capillary 593 (HH) 65 - 99 mg/dL  Troponin I     Status: Abnormal   Collection Time: 2015/10/18  1:12 AM  Result Value Ref Range   Troponin I 0.04 (H) <0.031 ng/mL    Comment:        PERSISTENTLY INCREASED TROPONIN VALUES IN THE RANGE OF 0.04-0.49 ng/mL CAN BE SEEN IN:       -UNSTABLE ANGINA       -CONGESTIVE HEART FAILURE       -MYOCARDITIS       -CHEST TRAUMA       -ARRYHTHMIAS       -LATE PRESENTING MYOCARDIAL INFARCTION       -COPD   CLINICAL FOLLOW-UP RECOMMENDED.   CBG monitoring, ED     Status: Abnormal   Collection Time: 10-18-15  2:22 AM  Result Value Ref Range   Glucose-Capillary >600 (HH) 65 - 99 mg/dL   Comment 1 Notify RN   Blood gas, arterial     Status: Abnormal   Collection Time: 18-Oct-2015  2:23 AM  Result Value Ref Range   FIO2 0.50    Delivery systems VENTILATOR    Mode PRESSURE REGULATED VOLUME CONTROL    VT 520 mL   LHR 14.0 resp/min   Peep/cpap 5.0 cm H20   pH, Arterial 6.695 (LL) 7.350 - 7.450    Comment: CRITICAL RESULT CALLED TO, READ BACK BY AND VERIFIED WITH:  DR [facility] BY BFRETWELL RRT ON 10-18-2015 AT 0235    pCO2 arterial 22.9 (L) 35.0 - 45.0 mmHg   pO2, Arterial 274 (H) 80.0 - 100.0 mmHg   Bicarbonate 3.8 (L) 20.0 - 24.0 mEq/L   Acid-base deficit 29.2 (H) 0.0 - 2.0 mmol/L   O2 Saturation  97.1 %   Collection site LEFT RADIAL    Drawn by [id number]    Sample type ARTERIAL DRAW    Allens test (pass/fail) PASS PASS  Glucose, capillary     Status: Abnormal   Collection Time: 10/18/2015  4:12 AM  Result Value Ref Range   Glucose-Capillary 470 (H) 65 - 99 mg/dL   Comment 1 Notify RN    Comment 2 Document in  Chart   Draw ABG 1 hour after initiation of ventilator     Status: Abnormal   Collection Time: October 17, 2015  5:13 AM  Result Value Ref Range   FIO2 0.60    Delivery systems VENTILATOR    Mode PRESSURE REGULATED VOLUME CONTROL    VT 560 mL   LHR 30 resp/min   Peep/cpap 5.0 cm H20   pH, Arterial 6.824 (LL) 7.350 - 7.450    Comment: CRITICAL RESULT CALLED TO, READ BACK BY AND VERIFIED WITH:  Vic Blackbird, RN ON 11/10/AT 0523 BY CHELSEA MCCOLLUM, RRT ON 2015-10-17    pCO2 arterial 17.4 (LL) 35.0 - 45.0 mmHg    Comment: CRITICAL RESULT CALLED TO, READ BACK BY AND VERIFIED WITH:  LINDSEY WALSH, RN AT [place], RRT ON 11 CRITICAL RESULT CALLED TO, READ BACK BY AND VERIFIED WITH:  Vic Blackbird, RN AT [place] RRT ON 2015/10/17    pO2, Arterial 265 (H) 80.0 - 100.0 mmHg   Bicarbonate 2.8 (L) 20.0 - 24.0 mEq/L   TCO2 3.3 0 - 100 mmol/L   Acid-base deficit 28.1 (H) 0.0 - 2.0 mmol/L   O2 Saturation 97.4 %   Patient temperature 97.3    Collection site LEFT RADIAL    Drawn by [id number]    Sample type ARTERIAL    Allens test (pass/fail) PASS PASS   Ct Head Wo Contrast  October 17, 2015  CLINICAL DATA:  Fall out of bed, found unresponsive, seizure. EXAM: CT HEAD WITHOUT CONTRAST CT CERVICAL SPINE WITHOUT CONTRAST TECHNIQUE: Multidetector CT imaging of the head and cervical spine was performed following the standard protocol without intravenous contrast. Multiplanar CT image reconstructions of the cervical spine were also generated. COMPARISON:  None. FINDINGS: CT HEAD FINDINGS Mild motion artifact related seizure. No intracranial hemorrhage, mass effect, or  midline shift. No hydrocephalus. The basilar cisterns are patent. No evidence of territorial infarct. No intracranial fluid collection. Calvarium is intact. Mucosal thickening of the ethmoid air cells, may be related to intubation. Mastoid air cells are clear. Mastoid air cells are well aerated. CT CERVICAL SPINE FINDINGS Mild motion artifact related seizure. Straightening of normal lordosis. There is no fracture. The dens is intact. There are no jumped or perched facets. Disc space narrowing and endplate spurring at I5-W3 and C6-C7. No prevertebral soft tissue edema. Patient is intubated. An enteric tube is in place. Mild emphysematous changes noted at the lung apices. IMPRESSION: 1. No acute intracranial abnormality, allowing for mild motion artifact. 2. Degenerative change in the cervical spine without acute fracture or subluxation. Electronically Signed   By: Jeb Levering M.D.   On: 2015-10-17 02:17   Ct Cervical Spine Wo Contrast  10-17-2015  CLINICAL DATA:  Fall out of bed, found unresponsive, seizure. EXAM: CT HEAD WITHOUT CONTRAST CT CERVICAL SPINE WITHOUT CONTRAST TECHNIQUE: Multidetector CT imaging of the head and cervical spine was performed following the standard protocol without intravenous contrast. Multiplanar CT image reconstructions of the cervical spine were also generated. COMPARISON:  None. FINDINGS: CT HEAD FINDINGS Mild motion artifact related seizure. No intracranial hemorrhage, mass effect, or midline shift. No hydrocephalus. The basilar cisterns are patent. No evidence of territorial infarct. No intracranial fluid collection. Calvarium is intact. Mucosal thickening of the ethmoid air cells, may be related to intubation. Mastoid air cells are clear. Mastoid air cells are well aerated. CT CERVICAL SPINE FINDINGS Mild motion artifact related seizure. Straightening of normal lordosis. There is no fracture. The dens is intact. There are  no jumped or perched facets. Disc space narrowing  and endplate spurring at W8-Z9 and C6-C7. No prevertebral soft tissue edema. Patient is intubated. An enteric tube is in place. Mild emphysematous changes noted at the lung apices. IMPRESSION: 1. No acute intracranial abnormality, allowing for mild motion artifact. 2. Degenerative change in the cervical spine without acute fracture or subluxation. Electronically Signed   By: Jeb Levering M.D.   On: Oct 13, 2015 02:17   Dg Chest Portable 1 View  10-13-15  CLINICAL DATA:  Endotracheal and NG tube placement. Fall from bed tonight. EXAM: PORTABLE CHEST 1 VIEW COMPARISON:  None. FINDINGS: Endotracheal tube is 1.9 cm from the carina. Enteric tube in place, tip below the diaphragm not included in the field of view. Lung volumes are low. Minimal right infrahilar atelectasis, lungs otherwise clear. Cardiomediastinal contours are normal. No pulmonary edema, large pleural effusion or pneumothorax. No acute osseous abnormalities are seen. IMPRESSION: 1. Endotracheal tube 1.9 cm from the carina. Tip of the enteric tube below the diaphragm not included in the field of view. 2. Low lung volumes with mild right infrahilar atelectasis. Electronically Signed   By: Jeb Levering M.D.   On: 10-13-2015 00:56    Assessment/Plan 39 year old man admitted with acute hyperglycemia and diabetic ketoacidosis as well as new onset generalized seizures. Etiology for seizure activity may be due to marked upper glycemia. However an acute structural brain lesion such as cerebral infarction cannot be ruled out at this point.  Recommendations: 1. Continue Keppra 500 mg every 12 hours 2. EEG to assess severity of encephalopathy as well as to rule out subclinical seizure activity 3. MRI of the brain contrast when feasible  We will continue to follow this patient with you.  This patient is critically ill and at significant risk of neurological worsening or death, and care requires constant monitoring of vital signs,  hemodynamics,respiratory and cardiac monitoring, neurological assessment, discussion with family, other specialists and medical decision making of high complexity. Total critical care time was 50 minutes.  C.R. Nicole Kindred, MD [facility] [phone]  2015/10/13, 5:35 AM

## 2015-10-29 NOTE — Code Documentation (Signed)
PCCM Attending Code Blue Note:  Code Blue was called after the patient went bradycardic that deteriorated to ventricular tachycardia. I arrived at the bedside with compressions in progress. ACLS protocol was continued. Patient was continued on continuous infusions of bicarbonate & norepinephrine. The patient's rhythm deteriorated to ventricular fibrillation. IV bicarbonate, calcium, epinephrine, amiodarone, & magnesium were administered per code documentation over the course of his attempted resuscitation. The patient received cardioversion for ventricular fibrillation witnessed during his rhythm checks. Acute labs drawn at bedside showed hyperkalemia, mild hypocalcemia, anemia, and profound metabolic acidosis. A final rhythm check was performed without evidence of cardiac contraction on bedside echocardiogram with asystole on his rhythm check. Patient's mother was at bedside prior to us pronouncing him dead at 8:41 am. Lunette StandsChaplain was present for support.  Donna ChristenJennings E. Jamison NeighborNestor, M.D. [facility] Pager:  [phone] After 3pm or if no response, call [phone]

## 2015-10-29 NOTE — ED Provider Notes (Signed)
CSN: [id number]     Arrival date & time 10/25/2015  0028 History   First MD Initiated Contact with Patient Oct 25, 2015 0045     Chief Complaint  Patient presents with  . Seizures      Patient is a 39 y.o. male presenting with seizures. The history is provided by the EMS personnel, a relative and a parent. The history is limited by the condition of the patient.  Seizures   Pt was seen on arrival to ED exam room. Per EMS and family report:  EMS called to home for seizure. Pt's family states they found pt having a seizure after they heard him fall out of the bed. EMS states pt was post-ictal on their arrival to scene and during transport. EMS states CBG en route read "high."  Family states pt has been c/o increased thirst and urination "for at least the past several weeks." Family states "diabetes runs in the family" but pt has not been previously dx with same.    History reviewed. No pertinent past medical history.   History reviewed. No pertinent past surgical history.   Social History  Substance Use Topics  . Smoking status: Unknown If Ever Smoked  . Smokeless tobacco: None  . Alcohol Use: None    Review of Systems  Unable to perform ROS: Acuity of condition      Allergies  Review of patient's allergies indicates not on file.  Home Medications   Prior to Admission medications   Not on File   BP 94/43 mmHg  Pulse 36  Resp 16  Ht 5\' 9"  (1.753 m)  Wt 180 lb (81.647 kg)  BMI 26.57 kg/m2  SpO2 100%   Patient Vitals for the past 24 hrs:  BP Temp Pulse Resp SpO2 Height Weight  25-Oct-2015 0215 117/57 mmHg (!) 96.3 F (35.7 C) - 23 - - -  10/25/2015 0200 102/61 mmHg (!) 96.8 F (36 C) 97 (!) 30 92 % - -  10-25-15 0145 (!) 101/50 mmHg - - - - - -  10/25/15 0130 (!) 72/64 mmHg 97.9 F (36.6 C) - 22 - - -  10-25-15 0115 (!) 85/68 mmHg 98.2 F (36.8 C) 102 18 99 % - -  10/25/2015 0049 - - - - - 5\' 9"  (1.753 m) 180 lb (81.647 kg)  10-25-15 0037 (!) 94/43 mmHg - (!) 36 16 100 % -  -      Physical Exam  0030: Physical examination: Vital signs and O2 SAT: Reviewed; Constitutional: Well developed, Well nourished, Unresponsive; Head and Face: Normocephalic, Atraumatic; Eyes: Pupils 5mm bilat, non-reactive.; ENMT: +superficial abrasions to left lower and upper lips. Teeth and tongue appear intact. No blood in oral cavity. Mucous membranes very dry. No gag when suctioned.; Neck: No lymphadenopathy, No crepitus, Trachea midline; Cardiovascular: Alternating bradycardic-tachycardic rate and rhythm on monitor, Central pulses present; Respiratory: Breath sounds clear & equal bilaterally.; Chest: No deformity, Movement normal, No crepitus; Abdomen: Soft, Nondistended.; Extremities: No deformity, No edema; Neuro: Unresponsive, GCS 3. +generalized tonic-clonic seizure, eyes deviated left.; Skin: Color normal, Dry, Cool.   ED Course  Procedures (including critical care time)   Airway procedure:  Timeout: Pre-procedure timeout not performed due to emergent nature of procedure; Indication: Unresponsive;  Oxygen Saturation: 100 %; Oxygen concentration: 100 %; Preoxygenation: Bag-valve-mask;  Medication: none; Procedure: Suctioning, RSI, Glidescope laryngoscopy, Endotracheal intubation with 7.1mm cuffed endotracheal tube, Bag-valve-tube ventilation, Mechanical ventilation;  Reassessment: Successful intubation, No bleeding or obvious trauma in oral cavity or posterior pharynx.  Teeth intact, no obvious trauma. Breath sounds equal bilaterally, No breath sounds heard over stomach, Chest movement symmetrical, CO2 detector color change, Endotracheal tube fogging, Oxygen saturation normal. Post-procedure xray obtained.   Vascular procedure:  Timeout: Pre-procedural timeout; Venous access: Left median antecubital;  Procedure: Informed consent obtained, Sterile technique used, Vein identified and localized using bedside ultrasound, Needle/catheter placement verified using bedside ultrasound,  Angiocath inserted, Sterile dressing applied;  Reassessment: Successful blood draw, Successful flush.     Labs Review Imaging Review I have personally reviewed and evaluated these images and lab results as part of my medical decision-making.   EKG Interpretation  Date/Time:  Thursday Oct 18, 2015 00:30:58 EST  On arrival Ventricular Rate:  21 PR Interval:    QRS Duration: 102 QT Interval:  504 QTC Calculation: 298 R Axis:   57 Text Interpretation:  Junctional rhythm Probable left ventricular  hypertrophy Repol abnrm, severe global ischemia (LM/MVD) Baseline wander  Artifact No old tracing to compare Confirmed by [facility]  MD, Tivon Lemoine  [phone]) on 2015/10/18 1:00:19 AM      EKG Interpretation  Date/Time:  Thursday October 18, 2015 01:02:16 EST   Repeat Ventricular Rate:  104 PR Interval:  142 QRS Duration: 85 QT Interval:  411 QTC Calculation: 541 R Axis:   64 Text Interpretation:  Sinus tachycardia Prolonged QT interval Since last tracing rate faster Confirmed by [facility]  MD, Nicholos Johns ([id number]) on 10-18-2015 1:05:57 AM        MDM  MDM Reviewed: nursing note and vitals Interpretation: ECG, x-ray, labs and CT scan Total time providing critical care: 75-105 minutes. This excludes time spent performing separately reportable procedures and services. Consults: critical care     CRITICAL CARE Performed by: Laray Anger Total critical care time: 100 minutes Critical care time was exclusive of separately billable procedures and treating other patients. Critical care was necessary to treat or prevent imminent or life-threatening deterioration. Critical care was time spent personally by me on the following activities: development of treatment plan with patient and/or surrogate as well as nursing, discussions with consultants, evaluation of patient's response to treatment, examination of patient, obtaining history from patient or surrogate, ordering and performing  treatments and interventions, ordering and review of laboratory studies, ordering and review of radiographic studies, pulse oximetry and re-evaluation of patient's condition.   Results for orders placed or performed during the hospital encounter of 10-18-2015  Urinalysis, Routine w reflex microscopic (not at [facility])  Result Value Ref Range   Color, Urine STRAW (A) YELLOW   APPearance HAZY (A) CLEAR   Specific Gravity, Urine 1.010 1.005 - 1.030   pH 5.5 5.0 - 8.0   Glucose, UA >1000 (A) NEGATIVE mg/dL   Hgb urine dipstick MODERATE (A) NEGATIVE   Bilirubin Urine NEGATIVE NEGATIVE   Ketones, ur 40 (A) NEGATIVE mg/dL   Protein, ur NEGATIVE NEGATIVE mg/dL   Urobilinogen, UA 0.2 0.0 - 1.0 mg/dL   Nitrite NEGATIVE NEGATIVE   Leukocytes, UA NEGATIVE NEGATIVE  Comprehensive metabolic panel  Result Value Ref Range   Sodium 140 135 - 145 mmol/L   Potassium 4.4 3.5 - 5.1 mmol/L   Chloride 102 101 - 111 mmol/L   CO2 <7 (L) 22 - 32 mmol/L   Glucose, Bld 799 (HH) 65 - 99 mg/dL   BUN 15 6 - 20 mg/dL   Creatinine, Ser 8.29 (H) 0.61 - 1.24 mg/dL   Calcium 9.6 8.9 - 56.2 mg/dL   Total Protein 5.5 (L) 6.5 - 8.1 g/dL  Albumin 3.1 (L) 3.5 - 5.0 g/dL   AST 47 (H) 15 - 41 U/L   ALT <5 (L) 17 - 63 U/L   Alkaline Phosphatase 112 38 - 126 U/L   Total Bilirubin 0.6 0.3 - 1.2 mg/dL   GFR calc non Af Amer 31 (L) >60 mL/min   GFR calc Af Amer 36 (L) >60 mL/min   Anion gap PENDING 5 - 15  Ethanol  Result Value Ref Range   Alcohol, Ethyl (B) <5 <5 mg/dL  Acetaminophen level  Result Value Ref Range   Acetaminophen (Tylenol), Serum <10 (L) 10 - 30 ug/mL  Lactic acid, plasma  Result Value Ref Range   Lactic Acid, Venous 26.9 (HH) 0.5 - 2.0 mmol/L  Salicylate level  Result Value Ref Range   Salicylate Lvl <4.0 2.8 - 30.0 mg/dL  CBC with Differential  Result Value Ref Range   WBC 11.8 (H) 4.0 - 10.5 K/uL   RBC 3.07 (L) 4.22 - 5.81 MIL/uL   Hemoglobin 9.9 (L) 13.0 - 17.0 g/dL   HCT 16.1 (L) 09.6 - 04.5 %    MCV 101.0 (H) 78.0 - 100.0 fL   MCH 32.2 26.0 - 34.0 pg   MCHC 31.9 30.0 - 36.0 g/dL   RDW 40.9 81.1 - 91.4 %   Platelets  150 - 400 K/uL    PLATELET CLUMPS NOTED ON SMEAR, COUNT APPEARS ADEQUATE   Neutrophils Relative % 35 %   Neutro Abs 4.3 1.7 - 7.7 K/uL   Lymphocytes Relative 57 %   Lymphs Abs 6.7 (H) 0.7 - 4.0 K/uL   Monocytes Relative 6 %   Monocytes Absolute 0.7 0.1 - 1.0 K/uL   Eosinophils Relative 1 %   Eosinophils Absolute 0.1 0.0 - 0.7 K/uL   Basophils Relative 1 %   Basophils Absolute 0.1 0.0 - 0.1 K/uL   WBC Morphology ATYPICAL LYMPHOCYTES    Smear Review      PLATELET CLUMPS NOTED ON SMEAR, COUNT APPEARS ADEQUATE  Urine rapid drug screen (hosp performed)  Result Value Ref Range   Opiates NONE DETECTED NONE DETECTED   Cocaine NONE DETECTED NONE DETECTED   Benzodiazepines NONE DETECTED NONE DETECTED   Amphetamines NONE DETECTED NONE DETECTED   Tetrahydrocannabinol NONE DETECTED NONE DETECTED   Barbiturates NONE DETECTED NONE DETECTED  Blood gas, arterial  ([facility], [facility], [facility])  Result Value Ref Range   FIO2 1.00    Delivery systems VENTILATOR    Mode PRESSURE REGULATED VOLUME CONTROL    VT 520 mL   LHR 14.0 resp/min   Peep/cpap 5.0 cm H20   pH, Arterial 6.629 (LL) 7.350 - 7.450   pCO2 arterial 29.4 (L) 35.0 - 45.0 mmHg   pO2, Arterial 521 (H) 80.0 - 100.0 mmHg   Bicarbonate 3.5 (L) 20.0 - 24.0 mEq/L   Acid-base deficit 29.4 (H) 0.0 - 2.0 mmol/L   O2 Saturation 98.1 %   Patient temperature 36.3    Collection site LEFT BRACHIAL    Drawn by [id number]    Sample type ARTERIAL DRAW   Magnesium  Result Value Ref Range   Magnesium 3.1 (H) 1.7 - 2.4 mg/dL  Troponin I  Result Value Ref Range   Troponin I 0.04 (H) <0.031 ng/mL  Urine microscopic-add on  Result Value Ref Range   Squamous Epithelial / LPF FEW (A) RARE   WBC, UA 0-2 <3 WBC/hpf   RBC / HPF 0-2 <3 RBC/hpf   Bacteria, UA MANY (A) RARE  Blood gas, arterial  Result Value Ref Range   FIO2 0.50    Delivery  systems VENTILATOR    Mode PRESSURE REGULATED VOLUME CONTROL    VT 520 mL   LHR 14.0 resp/min   Peep/cpap 5.0 cm H20   pH, Arterial 6.695 (LL) 7.350 - 7.450   pCO2 arterial 22.9 (L) 35.0 - 45.0 mmHg   pO2, Arterial 274 (H) 80.0 - 100.0 mmHg   Bicarbonate 3.8 (L) 20.0 - 24.0 mEq/L   Acid-base deficit 29.2 (H) 0.0 - 2.0 mmol/L   O2 Saturation 97.1 %   Collection site LEFT RADIAL    Drawn by [id number]    Sample type ARTERIAL DRAW    Allens test (pass/fail) PASS PASS  CBG monitoring, ED  Result Value Ref Range   Glucose-Capillary >600 (HH) 65 - 99 mg/dL  I-Stat Chem 8, ED  ([facility])  Result Value Ref Range   Sodium 139 135 - 145 mmol/L   Potassium 4.1 3.5 - 5.1 mmol/L   Chloride 101 101 - 111 mmol/L   BUN 15 6 - 20 mg/dL   Creatinine, Ser 7.822.10 (H) 0.61 - 1.24 mg/dL   Glucose, Bld >956>700 (HH) 65 - 99 mg/dL   Calcium, Ion 2.131.31 (H) 1.12 - 1.23 mmol/L   TCO2 7 0 - 100 mmol/L   Hemoglobin 12.6 (L) 13.0 - 17.0 g/dL   HCT 08.637.0 (L) 57.839.0 - 46.952.0 %   Comment NOTIFIED PHYSICIAN   CBG monitoring, ED  Result Value Ref Range   Glucose-Capillary 593 (HH) 65 - 99 mg/dL  CBG monitoring, ED  Result Value Ref Range   Glucose-Capillary >600 (HH) 65 - 99 mg/dL   Comment 1 Notify RN    Dg Chest Portable 1 View Nov 10, 2015  CLINICAL DATA:  Endotracheal and NG tube placement. Fall from bed tonight. EXAM: PORTABLE CHEST 1 VIEW COMPARISON:  None. FINDINGS: Endotracheal tube is 1.9 cm from the carina. Enteric tube in place, tip below the diaphragm not included in the field of view. Lung volumes are low. Minimal right infrahilar atelectasis, lungs otherwise clear. Cardiomediastinal contours are normal. No pulmonary edema, large pleural effusion or pneumothorax. No acute osseous abnormalities are seen. IMPRESSION: 1. Endotracheal tube 1.9 cm from the carina. Tip of the enteric tube below the diaphragm not included in the field of view. 2. Low lung volumes with mild right infrahilar atelectasis.  Electronically Signed   By: Rubye OaksMelanie  Ehinger M.D.   On: Nov 10, 2015 00:56   Ct Head Wo Contrast Nov 10, 2015  CLINICAL DATA:  Fall out of bed, found unresponsive, seizure. EXAM: CT HEAD WITHOUT CONTRAST CT CERVICAL SPINE WITHOUT CONTRAST TECHNIQUE: Multidetector CT imaging of the head and cervical spine was performed following the standard protocol without intravenous contrast. Multiplanar CT image reconstructions of the cervical spine were also generated. COMPARISON:  None. FINDINGS: CT HEAD FINDINGS Mild motion artifact related seizure. No intracranial hemorrhage, mass effect, or midline shift. No hydrocephalus. The basilar cisterns are patent. No evidence of territorial infarct. No intracranial fluid collection. Calvarium is intact. Mucosal thickening of the ethmoid air cells, may be related to intubation. Mastoid air cells are clear. Mastoid air cells are well aerated. CT CERVICAL SPINE FINDINGS Mild motion artifact related seizure. Straightening of normal lordosis. There is no fracture. The dens is intact. There are no jumped or perched facets. Disc space narrowing and endplate spurring at C5-C6 and C6-C7. No prevertebral soft tissue edema. Patient is intubated. An enteric tube is in place. Mild emphysematous changes noted at the lung  apices. IMPRESSION: 1. No acute intracranial abnormality, allowing for mild motion artifact. 2. Degenerative change in the cervical spine without acute fracture or subluxation. Electronically Signed   By: Rubye Oaks M.D.   On: 11/04/2015 02:17   Ct Cervical Spine Wo Contrast 11/04/2015  CLINICAL DATA:  Fall out of bed, found unresponsive, seizure. EXAM: CT HEAD WITHOUT CONTRAST CT CERVICAL SPINE WITHOUT CONTRAST TECHNIQUE: Multidetector CT imaging of the head and cervical spine was performed following the standard protocol without intravenous contrast. Multiplanar CT image reconstructions of the cervical spine were also generated. COMPARISON:  None. FINDINGS: CT HEAD  FINDINGS Mild motion artifact related seizure. No intracranial hemorrhage, mass effect, or midline shift. No hydrocephalus. The basilar cisterns are patent. No evidence of territorial infarct. No intracranial fluid collection. Calvarium is intact. Mucosal thickening of the ethmoid air cells, may be related to intubation. Mastoid air cells are clear. Mastoid air cells are well aerated. CT CERVICAL SPINE FINDINGS Mild motion artifact related seizure. Straightening of normal lordosis. There is no fracture. The dens is intact. There are no jumped or perched facets. Disc space narrowing and endplate spurring at C5-C6 and C6-C7. No prevertebral soft tissue edema. Patient is intubated. An enteric tube is in place. Mild emphysematous changes noted at the lung apices. IMPRESSION: 1. No acute intracranial abnormality, allowing for mild motion artifact. 2. Degenerative change in the cervical spine without acute fracture or subluxation. Electronically Signed   By: Rubye Oaks M.D.   On: 11-04-15 02:17      0030:  On arrival to ED: pt moved from EMS litter to ED stretcher and began to have generalized seizure with left gaze. As IV ativan was about to be given, seizure spontaneously stopped. Sats 100% on NRB, SBP 90's, HR alternating 40's and 100's. Resps deep and slow; appear Kussmaul. Pt intubated without difficulty, no gag when suctioned. I-stat chem obtained. CBG >600.  2nd IV site placed. IVF NS bolus x2 started. IV insulin ordered. NGT and temp foley placed.  0045:  SBP 114, HR 90's, Sats 100% on vent.  Minimal urine output from foley after placement. IVF boluses continue. Bear hugger in place. Family updated. Pt continues unresponsive, GCS 3, pupils NR bilat.   0110:  Pt had 2nd generalized seizure with left gaze. IV ativan given with resultant cessation of seizure activity as well as decrease in BP. 3rd IV placed. IVF boluses infusing with slow improvement in BP. IV keppra ordered.  Pt acidotic on ABG. T/C  to [facility] PCCM Dr. Sung Amabile, case discussed, including:  HPI, pertinent PM/SHx, VS/PE, dx testing, ED course and treatment:  Agrees with ED treatment, do not give bicarb at this time, requests to increase vent RR to 30, obtain 2nd ABG after CT-H obtained, transport to [facility] ICU. Family updated.   0220:  CT-H obtained (as above). Family updated. SBP 117, HR 90's, Sats 100% on vent. Minimal urine output. IVF boluses continue. No further seizure activity. Propofol held at this time, given pt's BP drop after ativan. [facility] here for transport.   0240:  BP now stable in 110-120's. Bear hugger in place. 2nd ABG as above. T/C to PCCM Dr. Sung Amabile for pt update: requests to start IV bicarb gtt at this time, and transport to [facility]. SBP 120's, HR 90's, Sats 100% on vent. Pt continues unresponsive, GCS 3, pupils NR bilat. Family updated by myself and [facility] staff.   0315:  [facility] states pt has left nares epistaxis (NGT is present in left nares). Topical  afrin applied before transport.     Samuel Jester, DO 10/09/15 [phone]

## 2015-10-29 NOTE — Discharge Summary (Signed)
Death Note: This is an unfortunate 39 year old African-American male transferred to our facility from [facility] Emergency Department on 08/01/2015. For complete account of the patient's history and physical on presentation please refer to the admission H&P dictated by Sandie Anoonald Desai, P.A. He was found at home with ongoing seizure activity. EMS was called and the patient was found to be hyperglycemic upon arrival. His seizure activity stopped spontaneously but given his significantly altered mentation he was intubated for airway protection. Subsequent CT scan of the head and cervical spine showed no evidence for an acute intracranial pathology or fracture. The patient later had a second seizure that was treated with IV Ativan. Per the patient's mother he had been having polyuria and polydipsia for at least 2 weeks prior. Patient reportedly had no use of tobacco, alcohol or drug history on file. Patient was in a state of profound metabolic acidosis with elevated lactic acid. He had multisystem organ failure including acute renal failure and elevated troponin I consistent with demand ischemia. The patient had evolving coagulopathy likely representing disseminated intravascular coagulation and progressively worsening anemia. The patient developed progressively worsening shock requiring vasopressor support with unclear etiology possibly secondary to cardiac or septic source given systemic inflammatory response syndrome. Central IV access was placed without complication. The patient's clinical status further deteriorated and ultimately he suffered cardiac arrest. After aggressive and heroic measures in an attempt to reestablish spontaneous circulation we were unsuccessful. At the request of the family and with them at bedside I pronounced the patient at 8:41 AM on 08/01/2015 with absence of any palpable pulse and no evidence for cardiac contractility on bedside thoracic ultrasound by my exam. The patient was in  asystole on monitor. Hospital chaplain was at bedside for family support. I recommended autopsy be performed and discussed this with the family. Case further to be discussed with the medical examiner's office.  Diagnoses at Death: 1. Cardiac Arrhythmia 2. Hyperkalemia 3. Profound Lactic Acidosis 4. Acute Renal Failure 5. New Onset Seizures 6. Myocardial Demand Ischemia 7. Systemic Inflammatory Response Syndrome 8. Anemia 9. Coagulopathy 10. Shock  11. Diabetic Ketoacidosis

## 2015-10-29 NOTE — Progress Notes (Signed)
Responded to referral from fellow chaplain to continue support to family and friends at bedside. Patient came to unit from [facility].  Early this morning patient coded and did not recover. Patient is deceased.  I prayed with family per their request and provided  guidance, words of comfort, emotional, spiritual and grief support.. Facilitated information sharing between staff and family. Patient mother were given Patience Placement card . Contact and funeral information were given to nurse.    06/30/2015 0900  Clinical Encounter Type  Visited With Family;Patient not available;Health care provider  Visit Type Initial;Spiritual support;Death  Referral From Chaplain  Spiritual Encounters  Spiritual Needs Prayer;Emotional;Grief support  Stress Factors  Family Stress Factors Exhausted;Loss  Marnee Spring[place], [place] Brent, Chaplain,[facility],- Pager [phone]

## 2015-10-29 NOTE — Procedures (Signed)
Central Venous Catheter Insertion Procedure Note Zachary BumpersBenjamin Weiss [id number] 08/10/1976  Procedure: Insertion of Central Venous Catheter Indications: Assessment of intravascular volume, Drug and/or fluid administration and Frequent blood sampling  Procedure Details Consent: Unable to obtain consent because of emergent medical necessity. Time Out: Verified patient identification, verified procedure, site/side was marked, verified correct patient position, special equipment/implants available, medications/allergies/relevent history reviewed, required imaging and test results available.  Performed  Maximum sterile technique was used including antiseptics, cap, gloves, gown, hand hygiene, mask and sheet. Skin prep: Chlorhexidine; local anesthetic administered A antimicrobial bonded/coated triple lumen catheter was placed in the left internal jugular vein using the Seldinger technique. Ultrasound guidance used.Yes.   Catheter placed to 20 cm. Blood aspirated via all 3 ports and then flushed x 3. Line sutured x 2 and dressing applied.  Evaluation Blood flow good Complications: No apparent complications Patient did tolerate procedure well. Chest X-ray ordered to verify placement.  CXR: pending.  Brett CanalesSteve Alton Bouknight ACNP [facility] Pager [phone] till 3 pm If no answer page [phone] 2015-02-14, 7:44 AM

## 2015-10-29 NOTE — ED Notes (Signed)
Ems dispatched on reference seizure activity, ems arrived on scene to find pt unresponsive, cbg reading high, pt arrived to er, had full body seizure that lasted approximately a minute, eyes deviated to the left. Dr Clarene DukeMcmanus notified, seizure activity stopped, pt placed on cardiac monitor, resp at bedside,

## 2015-10-29 NOTE — Progress Notes (Signed)
[facility] Physician-Brief Progress Note Patient Name: Zachary BumpersBenjamin Weiss DOB: 12/10/1975 MRN: [id number]   Date of Service  2015-04-04  HPI/Events of Note  Hypotension  eICU Interventions  NS 1000 cc Norepi gtt Will need CVL this AM Echocardiogram ordered     Intervention Category Major Interventions: Shock - evaluation and management  Billy FischerDavid Simonds 2015-04-04, 6:22 AM

## 2015-10-29 NOTE — Progress Notes (Signed)
[facility] off our vent and [facility]. Report called

## 2015-10-29 NOTE — ED Notes (Signed)
Laceration noted to left lip,

## 2015-10-29 NOTE — ED Notes (Signed)
Pt being assisted with bvm, hr 30's, Dr Clarene DukeMcmanus at bedside, blood noted in pt's mouth,

## 2015-10-29 NOTE — ED Notes (Signed)
Pt intubated with 7.5 ET tube by Dr Clarene DukeMcmanus, bilateral breath sounds noted, with positive end tidal co2 change,

## 2015-10-29 NOTE — Code Documentation (Signed)
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 While trying to put in an A-line this AM, the A-line clotted off and his vitals became unstable. Code blue was subsequently called. At the time of arrival on scene, ACLS protocol was underway.   Technical Description:  - CPR performance duration:  35  minutes  - Was defibrillation or cardioversion used? Yes   - Was external pacer placed? No  - Was patient intubated pre/post CPR? Yes    Medications Administered: Y = Yes; Blank = No Amiodarone  Y  Atropine    Calcium  Y  Epinephrine  Y  Lidocaine    Magnesium  Y  Norepinephrine  Y  Phenylephrine    Sodium bicarbonate  Y  Vasopressin    Other     Post CPR evaluation:  - Final Status - Was patient successfully resuscitated ? No   Miscellaneous Information:  - Time of death:  8:41  AM  - Primary team notified?  Yes  - Family Notified? Yes        Campbell StallKaty Dodd Mayo, MD   08/20/15, 8:45 AM

## 2015-10-29 DEATH — deceased

## 2017-07-04 IMAGING — CT CT CERVICAL SPINE W/O CM
3 of 5 series · 10 of 33 positions shown, 12 images · non-contrast
Comparison: None.

CLINICAL DATA: Fall out of bed, found unresponsive, seizure.

EXAM:
CT HEAD WITHOUT CONTRAST
CT CERVICAL SPINE WITHOUT CONTRAST
TECHNIQUE: Multidetector CT imaging of the head and cervical spine was
performed following the standard protocol without intravenous
contrast. Multiplanar CT image reconstructions of the cervical spine
were also generated.

[Series 5: cervical st 2.0 b31s · axial · 0.30mm/px · z∈[+62,+128]mm · 2 of 84 slices shown, 3 images]
[im 34/84  soft-tissue]
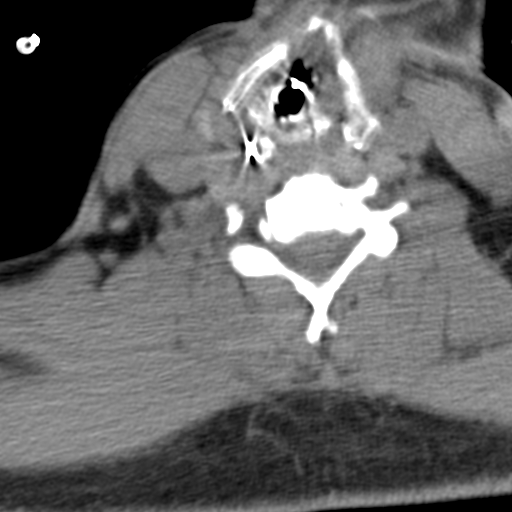
[im 34/84  bone]
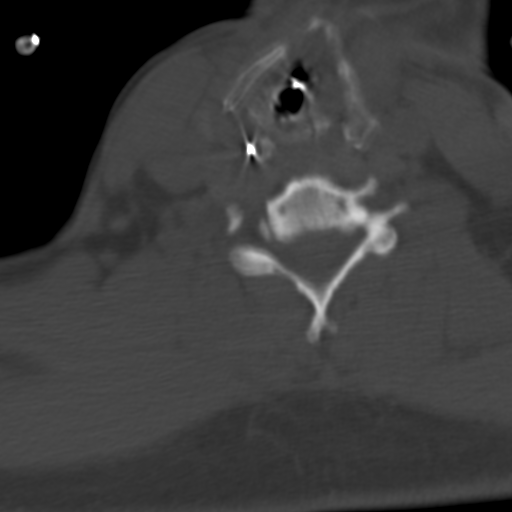
[im 67/84  bone]
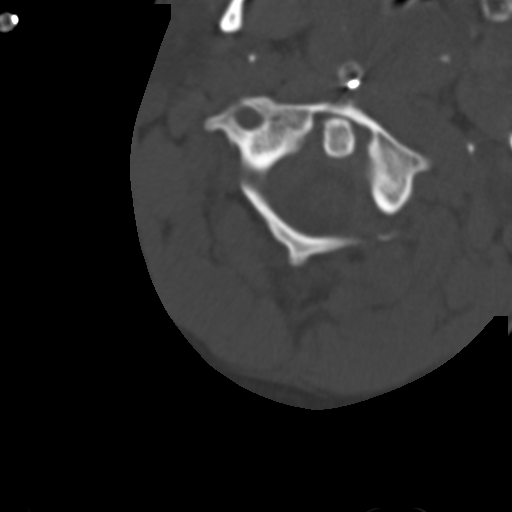

[Series 7: sagittal bone 2.0 · sagittal · 0.19mm/px · 5 of 59 slices shown, 6 images]
[im 20/59  bone]
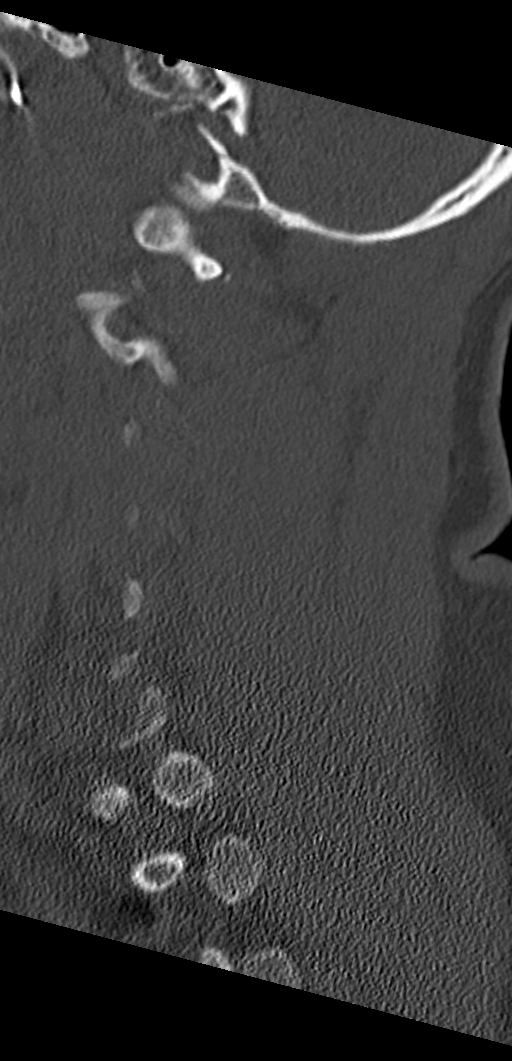
[im 25/59  bone]
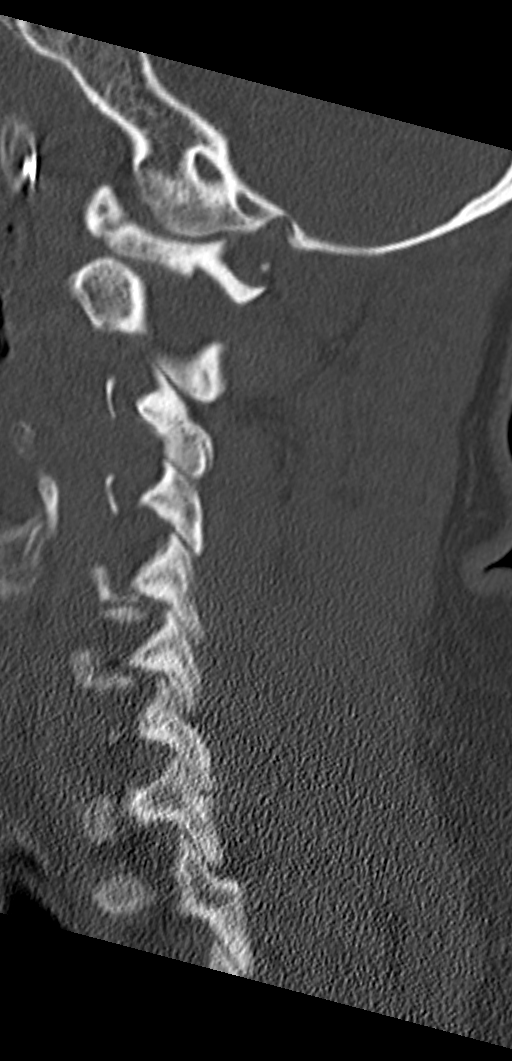
[im 30/59  soft-tissue]
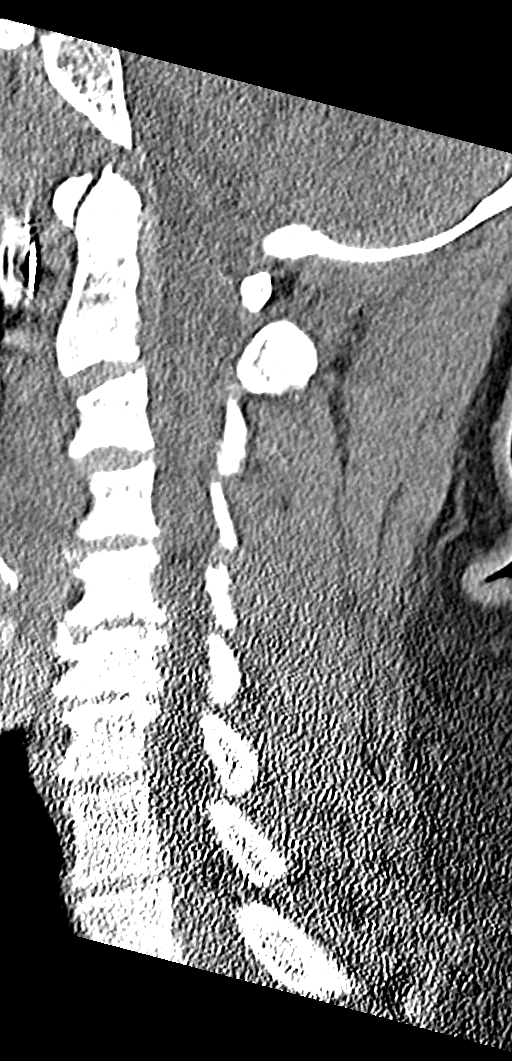
[im 30/59  bone]
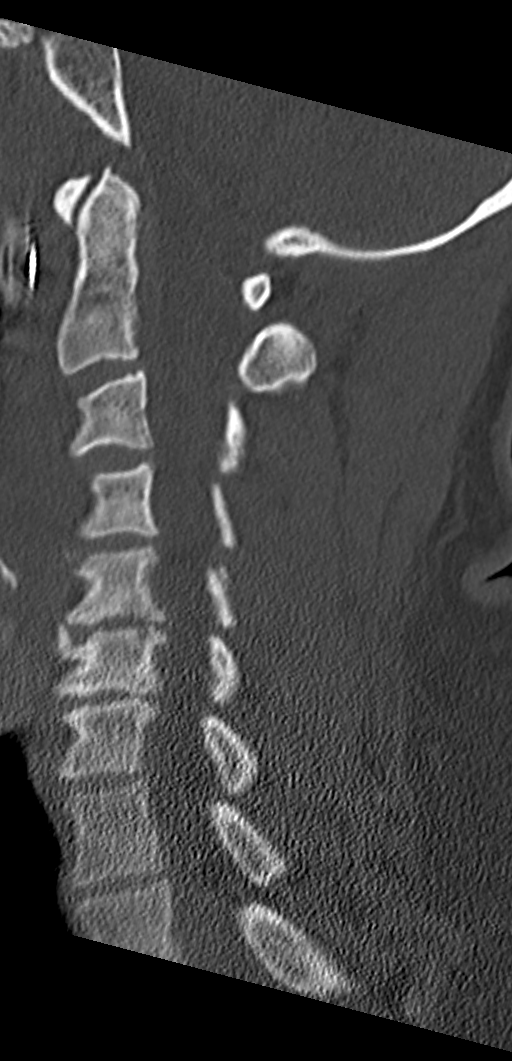
[im 34/59  bone]
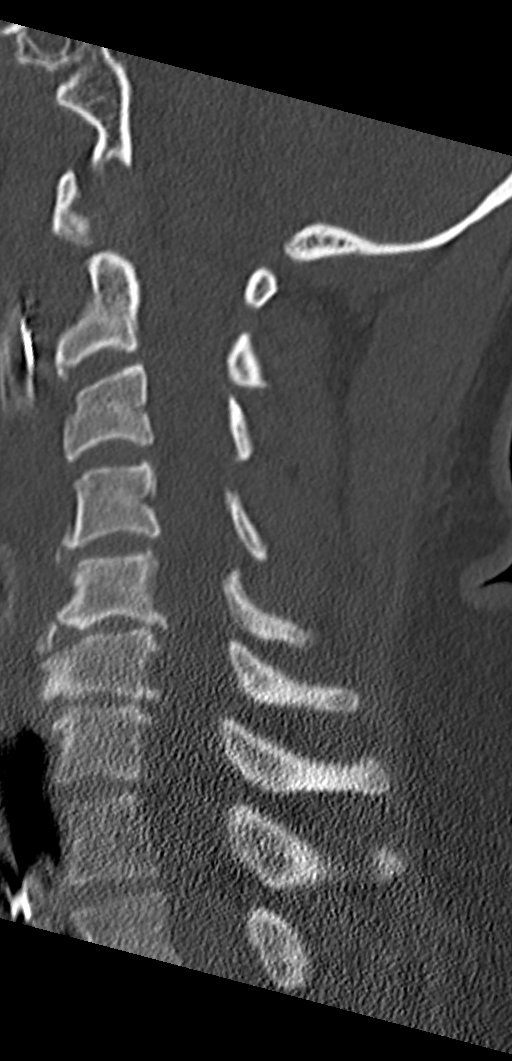
[im 39/59  bone]
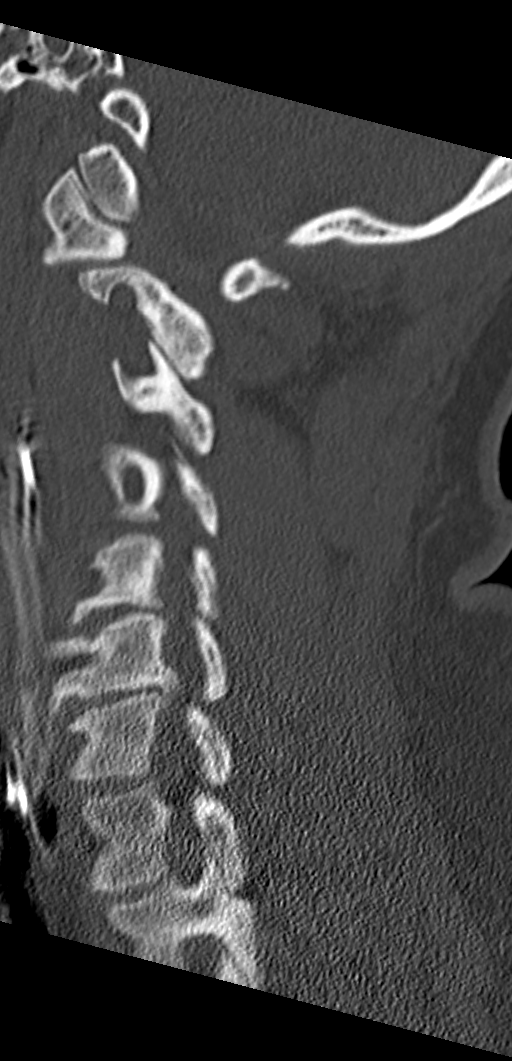

[Series 8: coronal bone 2.0 · coronal · 0.25mm/px · 3 of 47 slices shown]
[im 10/47  bone]
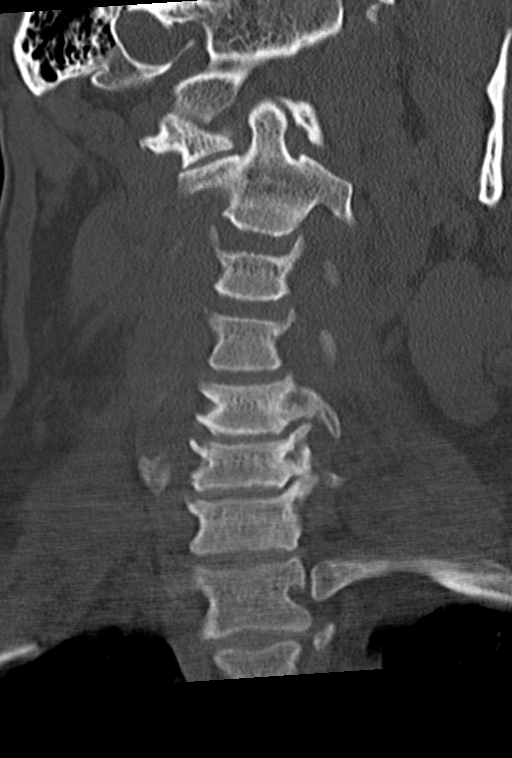
[im 19/47  bone]
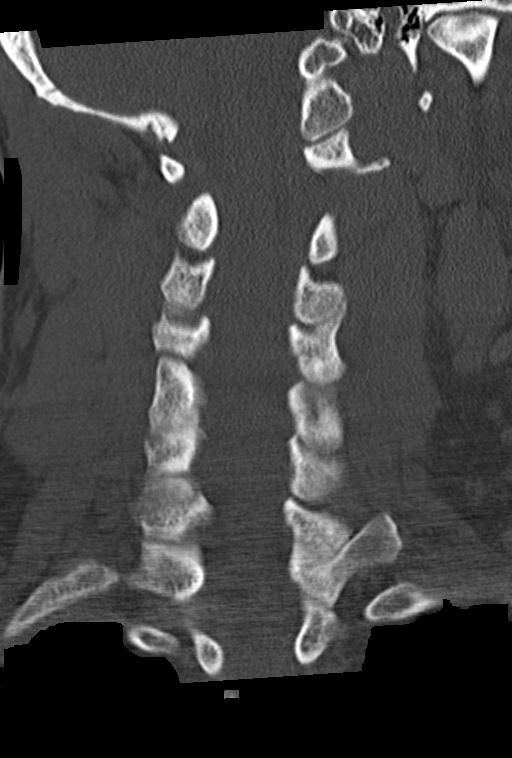
[im 28/47  bone]
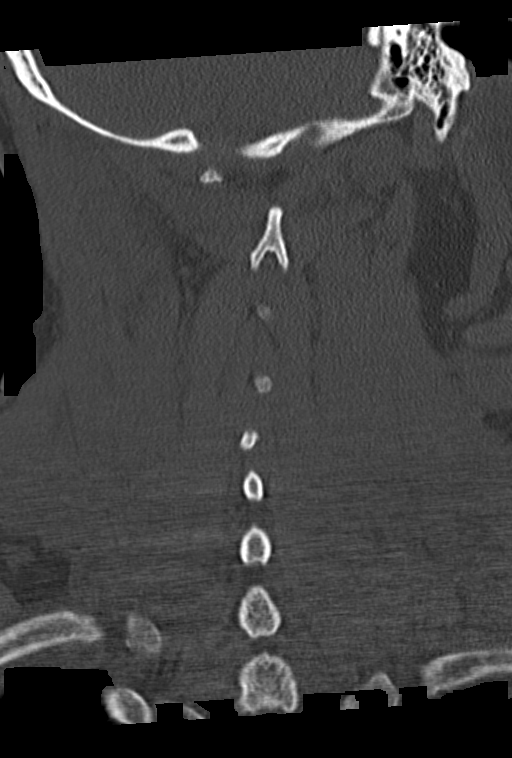

[10 of 33 positions shown; findings below may reference images not displayed]

FINDINGS: CT HEAD FINDINGS

Mild motion artifact related seizure. No intracranial hemorrhage,
mass effect, or midline shift. No hydrocephalus. The basilar
cisterns are patent. No evidence of territorial infarct. No
intracranial fluid collection. Calvarium is intact. Mucosal
thickening of the ethmoid air cells, may be related to intubation.
Mastoid air cells are clear. Mastoid air cells are well aerated.

CT CERVICAL SPINE FINDINGS

Mild motion artifact related seizure. Straightening of normal
lordosis. There is no fracture. The dens is intact. There are no
jumped or perched facets. Disc space narrowing and endplate spurring
at C5-C6 and C6-C7. No prevertebral soft tissue edema. Patient is
intubated. An enteric tube is in place. Mild emphysematous changes
noted at the lung apices.
IMPRESSION: 1. No acute intracranial abnormality, allowing for mild motion
artifact.
2. Degenerative change in the cervical spine without acute fracture
or subluxation.
